# Patient Record
Sex: Female | Born: 1965 | Race: White | Hispanic: No | Marital: Married | Smoking: Never smoker
Health system: Southern US, Community
[De-identification: ages and names within clinical notes are randomized; demographics above are authoritative.]

## PROBLEM LIST (undated history)

## (undated) DIAGNOSIS — N809 Endometriosis, unspecified: Secondary | ICD-10-CM

## (undated) DIAGNOSIS — Z9189 Other specified personal risk factors, not elsewhere classified: Secondary | ICD-10-CM

## (undated) DIAGNOSIS — Z808 Family history of malignant neoplasm of other organs or systems: Secondary | ICD-10-CM

## (undated) DIAGNOSIS — A630 Anogenital (venereal) warts: Secondary | ICD-10-CM

## (undated) DIAGNOSIS — Z86010 Personal history of colonic polyps: Secondary | ICD-10-CM

## (undated) DIAGNOSIS — Z803 Family history of malignant neoplasm of breast: Secondary | ICD-10-CM

## (undated) DIAGNOSIS — D72819 Decreased white blood cell count, unspecified: Secondary | ICD-10-CM

## (undated) DIAGNOSIS — Z83719 Family history of colon polyps, unspecified: Secondary | ICD-10-CM

## (undated) DIAGNOSIS — D649 Anemia, unspecified: Secondary | ICD-10-CM

## (undated) DIAGNOSIS — Z8371 Family history of colonic polyps: Secondary | ICD-10-CM

## (undated) DIAGNOSIS — R7989 Other specified abnormal findings of blood chemistry: Secondary | ICD-10-CM

## (undated) DIAGNOSIS — N390 Urinary tract infection, site not specified: Secondary | ICD-10-CM

## (undated) HISTORY — DX: Family history of malignant neoplasm of other organs or systems: Z80.8

## (undated) HISTORY — DX: Anemia, unspecified: D64.9

## (undated) HISTORY — DX: Family history of colon polyps, unspecified: Z83.719

## (undated) HISTORY — DX: Family history of colonic polyps: Z83.71

## (undated) HISTORY — PX: ABDOMINAL HYSTERECTOMY: SHX81

## (undated) HISTORY — DX: Urinary tract infection, site not specified: N39.0

## (undated) HISTORY — DX: Other specified personal risk factors, not elsewhere classified: Z91.89

## (undated) HISTORY — DX: Endometriosis, unspecified: N80.9

## (undated) HISTORY — DX: Decreased white blood cell count, unspecified: D72.819

## (undated) HISTORY — DX: Anogenital (venereal) warts: A63.0

## (undated) HISTORY — DX: Other specified abnormal findings of blood chemistry: R79.89

## (undated) HISTORY — DX: Personal history of colonic polyps: Z86.010

## (undated) HISTORY — DX: Family history of malignant neoplasm of breast: Z80.3

---

## 2011-08-29 HISTORY — PX: BILATERAL SALPINGECTOMY: SHX5743

## 2011-08-29 HISTORY — PX: VAGINAL HYSTERECTOMY: SUR661

## 2016-06-29 HISTORY — PX: COLONOSCOPY W/ POLYPECTOMY: SHX1380

## 2016-07-15 DIAGNOSIS — Z860101 Personal history of adenomatous and serrated colon polyps: Secondary | ICD-10-CM

## 2016-07-15 DIAGNOSIS — Z86010 Personal history of colonic polyps: Secondary | ICD-10-CM | POA: Insufficient documentation

## 2016-07-15 HISTORY — DX: Personal history of colonic polyps: Z86.010

## 2016-07-15 HISTORY — DX: Personal history of adenomatous and serrated colon polyps: Z86.0101

## 2017-11-22 DIAGNOSIS — M25521 Pain in right elbow: Secondary | ICD-10-CM | POA: Diagnosis not present

## 2018-01-03 DIAGNOSIS — M7711 Lateral epicondylitis, right elbow: Secondary | ICD-10-CM | POA: Diagnosis not present

## 2018-01-17 DIAGNOSIS — D1801 Hemangioma of skin and subcutaneous tissue: Secondary | ICD-10-CM | POA: Diagnosis not present

## 2018-01-17 DIAGNOSIS — L723 Sebaceous cyst: Secondary | ICD-10-CM | POA: Diagnosis not present

## 2018-01-17 DIAGNOSIS — L814 Other melanin hyperpigmentation: Secondary | ICD-10-CM | POA: Diagnosis not present

## 2018-01-17 DIAGNOSIS — D225 Melanocytic nevi of trunk: Secondary | ICD-10-CM | POA: Diagnosis not present

## 2018-08-31 ENCOUNTER — Ambulatory Visit (INDEPENDENT_AMBULATORY_CARE_PROVIDER_SITE_OTHER): Payer: 59 | Admitting: Family Medicine

## 2018-08-31 ENCOUNTER — Encounter: Payer: Self-pay | Admitting: Family Medicine

## 2018-08-31 ENCOUNTER — Ambulatory Visit: Payer: Self-pay | Admitting: Nurse Practitioner

## 2018-08-31 ENCOUNTER — Other Ambulatory Visit: Payer: Self-pay | Admitting: Family Medicine

## 2018-08-31 VITALS — BP 104/80 | HR 68 | Temp 97.5°F | Ht 65.0 in | Wt 136.6 lb

## 2018-08-31 DIAGNOSIS — R5383 Other fatigue: Secondary | ICD-10-CM

## 2018-08-31 DIAGNOSIS — Z7689 Persons encountering health services in other specified circumstances: Secondary | ICD-10-CM

## 2018-08-31 DIAGNOSIS — Z86010 Personal history of colonic polyps: Secondary | ICD-10-CM

## 2018-08-31 DIAGNOSIS — Z87898 Personal history of other specified conditions: Secondary | ICD-10-CM

## 2018-08-31 DIAGNOSIS — Z1231 Encounter for screening mammogram for malignant neoplasm of breast: Secondary | ICD-10-CM

## 2018-08-31 NOTE — Progress Notes (Signed)
Patient presents to clinic today to establish care.  SUBJECTIVE: PMH: Pt is a 53 yo female with pmh sig for syncope, genital warts, history of polyps.  Pt previously seen outside of [place], [place].  Fatigue: - x last few wks -notes increased tiredness in afternoons -works a half day as a Optician, dispensing -endorses h/o Vit D def.and anemia -not currently taking any medications or supplements -gets 7 hrs of sleep -denies h/o snoring  H/o syncope: -happened x 2 in 1998 -pt was training for a marathon -thinks was 2/2 dehydration  H/o colon polyp: -noted in 2017 on colonoscopy -benign adenoma -will need repeat colonoscopy in 5 yrs -denies rectal bleeding or dark stools  H/o genital warts: -states "took medicine" for them -denies current lesions or issues.  Allergies: Codeine- dizzy, shakey , sweaty.  Notes emesis with a codeine derivative.  Past surg hx: Partial hysterectomy in 2012 2/2 cervical cancer.  Social hx: -pt is married.  She works as a Development worker, community.  Pt endorses social EtOH use.  Pt denies tobacco or drug use.  Does not eat breakfast.   Health Maintenance: Dental --Dr. Oren Binet Immunizations --influenza vaccine 2019, TB test 2018 Colonoscopy --2017 Mammogram --Aug 2018 PAP --2012  Past Medical History:  Diagnosis Date  . Family history of polyps in the colon   . Genital warts   . History of fainting spells of unknown cause   . UTI (urinary tract infection)     Past Surgical History:  Procedure Laterality Date  . ABDOMINAL HYSTERECTOMY      No current outpatient medications on file prior to visit.   No current facility-administered medications on file prior to visit.     Allergies  Allergen Reactions  . Codeine     History reviewed. No pertinent family history.  Social History   Socioeconomic History  . Marital status: Married    Spouse name: Not on file  . Number of children: Not on file  . Years of education: Not on file  . Highest  education level: Not on file  Occupational History  . Not on file  Social Needs  . Financial resource strain: Not on file  . Food insecurity:    Worry: Not on file    Inability: Not on file  . Transportation needs:    Medical: Not on file    Non-medical: Not on file  Tobacco Use  . Smoking status: Never Smoker  . Smokeless tobacco: Never Used  Substance and Sexual Activity  . Alcohol use: Yes  . Drug use: Never  . Sexual activity: Yes  Lifestyle  . Physical activity:    Days per week: Not on file    Minutes per session: Not on file  . Stress: Not on file  Relationships  . Social connections:    Talks on phone: Not on file    Gets together: Not on file    Attends religious service: Not on file    Active member of club or organization: Not on file    Attends meetings of clubs or organizations: Not on file    Relationship status: Not on file  . Intimate partner violence:    Fear of current or ex partner: Not on file    Emotionally abused: Not on file    Physically abused: Not on file    Forced sexual activity: Not on file  Other Topics Concern  . Not on file  Social History Narrative  . Not on file  ROS General: Denies fever, chills, night sweats, changes in weight, changes in appetite  +fatigue HEENT: Denies headaches, ear pain, changes in vision, rhinorrhea, sore throat CV: Denies CP, palpitations, SOB, orthopnea Pulm: Denies SOB, cough, wheezing GI: Denies abdominal pain, nausea, vomiting, diarrhea, constipation GU: Denies dysuria, hematuria, frequency, vaginal discharge Msk: Denies muscle cramps, joint pains Neuro: Denies weakness, numbness, tingling Skin: Denies rashes, bruising Psych: Denies depression, anxiety, hallucinations   BP 104/80 (BP Location: Right Arm, Patient Position: Sitting, Cuff Size: Normal)   Pulse 68   Temp (!) 97.5 F (36.4 C) (Oral)   Ht 5\' 5"  (1.651 m)   Wt 136 lb 9.6 oz (62 kg)   SpO2 98%   BMI 22.73 kg/m   Physical  Exam Gen. Pleasant, well developed, well-nourished, in NAD HEENT - [place]/AT, PERRLA,  no scleral icterus, no nasal drainage, pharynx without erythema or exudate.  TMs nml b/l. Lungs: no use of accessory muscles, CTAB, no wheezes, rales or rhonchi Cardiovascular: RRR, No r/g/m, no peripheral edema Abdomen: BS present, soft, nontender,nondistended Neuro:  A&Ox3, CN II-XII intact, normal gait Skin:  Warm, dry, intact, no lesions  No results found for this or any previous visit (from the past 2160 hour(s)).  Assessment/Plan: Fatigue, unspecified type -Discussed various causes including anemia, vitamin D deficiency, thyroid dysfunction -Patient wishes to wait on labs until CPE -Given handout -Encouraged to stay hydrated, continue exercising, eat a balanced diet.  History of syncope -Resolved -Pt encouraged to stay hydrated and eat regularly  History of colon polyps -f/u colonoscopy in 2022  Encounter to establish care -We reviewed the PMH, PSH, FH, SH, Meds and Allergies. -We provided refills for any medications we will prescribe as needed. -We addressed current concerns per orders and patient instructions. -We have asked for records for pertinent exams, studies, vaccines and notes from previous providers. -We have advised patient to follow up per instructions below.  Follow-up in the next few weeks for CPE and labs  Grier Mitts, MD

## 2018-08-31 NOTE — Patient Instructions (Signed)

## 2018-09-12 ENCOUNTER — Ambulatory Visit (INDEPENDENT_AMBULATORY_CARE_PROVIDER_SITE_OTHER): Payer: 59 | Admitting: Family Medicine

## 2018-09-12 ENCOUNTER — Encounter: Payer: Self-pay | Admitting: Family Medicine

## 2018-09-12 VITALS — BP 98/68 | HR 62 | Temp 97.9°F | Wt 136.0 lb

## 2018-09-12 DIAGNOSIS — Z13220 Encounter for screening for lipoid disorders: Secondary | ICD-10-CM | POA: Diagnosis not present

## 2018-09-12 DIAGNOSIS — Z0000 Encounter for general adult medical examination without abnormal findings: Secondary | ICD-10-CM

## 2018-09-12 DIAGNOSIS — Z131 Encounter for screening for diabetes mellitus: Secondary | ICD-10-CM

## 2018-09-12 LAB — BASIC METABOLIC PANEL
BUN: 14 mg/dL (ref 6–23)
CO2: 30 mEq/L (ref 19–32)
Calcium: 9.2 mg/dL (ref 8.4–10.5)
Chloride: 106 mEq/L (ref 96–112)
Creatinine, Ser: 0.81 mg/dL (ref 0.40–1.20)
GFR: 78.55 mL/min (ref >60.00)
Glucose, Bld: 81 mg/dL (ref 70–99)
Potassium: 3.9 mEq/L (ref 3.5–5.1)
Sodium: 142 mEq/L (ref 135–145)

## 2018-09-12 LAB — CBC
HCT: 44.4 % (ref 36.0–46.0)
Hemoglobin: 15.1 g/dL — ABNORMAL HIGH (ref 12.0–15.0)
MCHC: 34 g/dL (ref 30.0–36.0)
MCV: 92.4 fl (ref 78.0–100.0)
Platelets: 163 10*3/uL (ref 150.0–400.0)
RBC: 4.8 Mil/uL (ref 3.87–5.11)
RDW: 12.5 % (ref 11.5–15.5)
WBC: 3.5 10*3/uL — ABNORMAL LOW (ref 4.0–10.5)

## 2018-09-12 LAB — LIPID PANEL
CHOLESTEROL: 191 mg/dL (ref 0–200)
HDL: 73.9 mg/dL (ref >39.00)
LDL Cholesterol: 109 mg/dL — ABNORMAL HIGH (ref 0–99)
NonHDL: 117.58
Total CHOL/HDL Ratio: 3
Triglycerides: 44 mg/dL (ref 0.0–149.0)
VLDL: 8.8 mg/dL (ref 0.0–40.0)

## 2018-09-12 LAB — HEMOGLOBIN A1C: Hgb A1c MFr Bld: 5.3 % (ref 4.6–6.5)

## 2018-09-12 NOTE — Progress Notes (Signed)
Subjective:     Mercedes Kirby is a 53 y.o. female and is here for a comprehensive physical exam. The patient reports no problems.  Pt notes a hx of throat cancer in her dad and grandfather.  Neither used tobacco products.  Patient thinks she had a Tdap recently. Patient scheduling mammogram.  States radiologist would like to review her prior records to determine if she needs a diagnostic mammogram.  Colonoscopy-2017 Influenza vaccine 2019, TB skin test 2018  Pap-2012 status post partial hysterectomy.  Patient unsure if she is going into menopause as she had intense sweating at night in the hot flash while cooking.  Pt's mother underwent complete hysterectomy and her sisters are younger than she is so she is unable to asked them about menopause.  Social History   Socioeconomic History  . Marital status: Married    Spouse name: Not on file  . Number of children: Not on file  . Years of education: Not on file  . Highest education level: Not on file  Occupational History  . Not on file  Social Needs  . Financial resource strain: Not on file  . Food insecurity:    Worry: Not on file    Inability: Not on file  . Transportation needs:    Medical: Not on file    Non-medical: Not on file  Tobacco Use  . Smoking status: Never Smoker  . Smokeless tobacco: Never Used  Substance and Sexual Activity  . Alcohol use: Yes  . Drug use: Never  . Sexual activity: Yes  Lifestyle  . Physical activity:    Days per week: Not on file    Minutes per session: Not on file  . Stress: Not on file  Relationships  . Social connections:    Talks on phone: Not on file    Gets together: Not on file    Attends religious service: Not on file    Active member of club or organization: Not on file    Attends meetings of clubs or organizations: Not on file    Relationship status: Not on file  . Intimate partner violence:    Fear of current or ex partner: Not on file    Emotionally abused: Not on file     Physically abused: Not on file    Forced sexual activity: Not on file  Other Topics Concern  . Not on file  Social History Narrative  . Not on file   Health Maintenance  Topic Date Due  . HIV Screening  06/29/1980  . TETANUS/TDAP  06/29/1984  . PAP SMEAR-Modifier  06/29/1986  . MAMMOGRAM  06/30/2015  . COLONOSCOPY  06/30/2015  . INFLUENZA VACCINE  Completed    The following portions of the patient's history were reviewed and updated as appropriate: allergies, current medications, past family history, past medical history, past social history, past surgical history and problem list.  Review of Systems Pertinent items noted in HPI and remainder of comprehensive ROS otherwise negative.   Objective:    BP 98/68 (BP Location: Left Arm, Patient Position: Sitting, Cuff Size: Normal)   Pulse 62   Temp 97.9 F (36.6 C) (Oral)   Wt 136 lb (61.7 kg)   SpO2 99%   BMI 22.63 kg/m  General appearance: alert, cooperative, appears stated age and no distress Head: Normocephalic, without obvious abnormality, atraumatic Eyes: conjunctivae/corneas clear. PERRL, EOM's intact. Fundi benign. Ears: normal TM's and external ear canals both ears Nose: Nares normal. Septum midline. Mucosa normal.  No drainage or sinus tenderness. Throat: lips, mucosa, and tongue normal; teeth and gums normal Neck: no adenopathy, no carotid bruit, no JVD, supple, symmetrical, trachea midline and thyroid not enlarged, symmetric, no tenderness/mass/nodules Lungs: clear to auscultation bilaterally Heart: regular rate and rhythm, S1, S2 normal, no murmur, click, rub or gallop Abdomen: soft, non-tender; bowel sounds normal; no masses,  no organomegaly Extremities: extremities normal, atraumatic, no cyanosis or edema Skin: Skin color, texture, turgor normal. No rashes or lesions Neurologic: Alert and oriented X 3, normal strength and tone. Normal symmetric reflexes. Normal coordination and gait    Assessment:     Healthy female exam.      Plan:     Anticipatory guidance given including wearing seatbelts, smoke detectors in the home, increasing physical activity, increasing p.o. intake of water and vegetables. -given handout -Obtain labs this visit -Patient to check on mammogram.  Will place orders for diagnostic mammogram if needed -Colonoscopy up-to-date -Immunizations up-to-date.  Patient to provide immunization record regarding Tdap. -Next CPE in 1 year See After Visit Summary for Counseling Recommendations    Given information regarding perimenopause and menopause.  Follow-up PRN  Grier Mitts, MD

## 2018-09-12 NOTE — Patient Instructions (Signed)
Preventive Care 40-64 Years, Female Preventive care refers to lifestyle choices and visits with your health care provider that can promote health and wellness. What does preventive care include?   A yearly physical exam. This is also called an annual well check.  Dental exams once or twice a year.  Routine eye exams. Ask your health care provider how often you should have your eyes checked.  Personal lifestyle choices, including: ? Daily care of your teeth and gums. ? Regular physical activity. ? Eating a healthy diet. ? Avoiding tobacco and drug use. ? Limiting alcohol use. ? Practicing safe sex. ? Taking low-dose aspirin daily starting at age 50. ? Taking vitamin and mineral supplements as recommended by your health care provider. What happens during an annual well check? The services and screenings done by your health care provider during your annual well check will depend on your age, overall health, lifestyle risk factors, and family history of disease. Counseling Your health care provider may ask you questions about your:  Alcohol use.  Tobacco use.  Drug use.  Emotional well-being.  Home and relationship well-being.  Sexual activity.  Eating habits.  Work and work environment.  Method of birth control.  Menstrual cycle.  Pregnancy history. Screening You may have the following tests or measurements:  Height, weight, and BMI.  Blood pressure.  Lipid and cholesterol levels. These may be checked every 5 years, or more frequently if you are over 50 years old.  Skin check.  Lung cancer screening. You may have this screening every year starting at age 55 if you have a 30-pack-year history of smoking and currently smoke or have quit within the past 15 years.  Colorectal cancer screening. All adults should have this screening starting at age 50 and continuing until age 75. Your health care provider may recommend screening at age 45. You will have tests every  1-10 years, depending on your results and the type of screening test. People at increased risk should start screening at an earlier age. Screening tests may include: ? Guaiac-based fecal occult blood testing. ? Fecal immunochemical test (FIT). ? Stool DNA test. ? Virtual colonoscopy. ? Sigmoidoscopy. During this test, a flexible tube with a tiny camera (sigmoidoscope) is used to examine your rectum and lower colon. The sigmoidoscope is inserted through your anus into your rectum and lower colon. ? Colonoscopy. During this test, a long, thin, flexible tube with a tiny camera (colonoscope) is used to examine your entire colon and rectum.  Hepatitis C blood test.  Hepatitis B blood test.  Sexually transmitted disease (STD) testing.  Diabetes screening. This is done by checking your blood sugar (glucose) after you have not eaten for a while (fasting). You may have this done every 1-3 years.  Mammogram. This may be done every 1-2 years. Talk to your health care provider about when you should start having regular mammograms. This may depend on whether you have a family history of breast cancer.  BRCA-related cancer screening. This may be done if you have a family history of breast, ovarian, tubal, or peritoneal cancers.  Pelvic exam and Pap test. This may be done every 3 years starting at age 21. Starting at age 30, this may be done every 5 years if you have a Pap test in combination with an HPV test.  Bone density scan. This is done to screen for osteoporosis. You may have this scan if you are at high risk for osteoporosis. Discuss your test results, treatment options,   and if necessary, the need for more tests with your health care provider. Vaccines Your health care provider may recommend certain vaccines, such as:  Influenza vaccine. This is recommended every year.  Tetanus, diphtheria, and acellular pertussis (Tdap, Td) vaccine. You may need a Td booster every 10 years.  Varicella  vaccine. You may need this if you have not been vaccinated.  Zoster vaccine. You may need this after age 41.  Measles, mumps, and rubella (MMR) vaccine. You may need at least one dose of MMR if you were born in 1957 or later. You may also need a second dose.  Pneumococcal 13-valent conjugate (PCV13) vaccine. You may need this if you have certain conditions and were not previously vaccinated.  Pneumococcal polysaccharide (PPSV23) vaccine. You may need one or two doses if you smoke cigarettes or if you have certain conditions.  Meningococcal vaccine. You may need this if you have certain conditions.  Hepatitis A vaccine. You may need this if you have certain conditions or if you travel or work in places where you may be exposed to hepatitis A.  Hepatitis B vaccine. You may need this if you have certain conditions or if you travel or work in places where you may be exposed to hepatitis B.  Haemophilus influenzae type b (Hib) vaccine. You may need this if you have certain conditions. Talk to your health care provider about which screenings and vaccines you need and how often you need them. This information is not intended to replace advice given to you by your health care provider. Make sure you discuss any questions you have with your health care provider. Document Released: 09/11/2015 Document Revised: 10/05/2017 Document Reviewed: 06/16/2015 Elsevier Interactive Patient Education  2019 [place].  Menopause Menopause is the normal time of life when menstrual periods stop completely. It is usually confirmed by 12 months without a menstrual period. The transition to menopause (perimenopause) most often happens between the ages of 70 and 90. During perimenopause, hormone levels change in your body, which can cause symptoms and affect your health. Menopause may increase your risk for:  Loss of bone (osteoporosis), which causes bone breaks (fractures).  Depression.  Hardening and narrowing  of the arteries (atherosclerosis), which can cause heart attacks and strokes. What are the causes? This condition is usually caused by a natural change in hormone levels that happens as you get older. The condition may also be caused by surgery to remove both ovaries (bilateral oophorectomy). What increases the risk? This condition is more likely to start at an earlier age if you have certain medical conditions or treatments, including:  A tumor of the pituitary gland in the brain.  A disease that affects the ovaries and hormone production.  Radiation treatment for cancer.  Certain cancer treatments, such as chemotherapy or hormone (anti-estrogen) therapy.  Heavy smoking and excessive alcohol use.  Family history of early menopause. This condition is also more likely to develop earlier in women who are very thin. What are the signs or symptoms? Symptoms of this condition include:  Hot flashes.  Irregular menstrual periods.  Night sweats.  Changes in feelings about sex. This could be a decrease in sex drive or an increased comfort around your sexuality.  Vaginal dryness and thinning of the vaginal walls. This may cause painful intercourse.  Dryness of the skin and development of wrinkles.  Headaches.  Problems sleeping (insomnia).  Mood swings or irritability.  Memory problems.  Weight gain.  Hair growth on the face  and chest.  Bladder infections or problems with urinating. How is this diagnosed? This condition is diagnosed based on your medical history, a physical exam, your age, your menstrual history, and your symptoms. Hormone tests may also be done. How is this treated? In some cases, no treatment is needed. You and your health care provider should make a decision together about whether treatment is necessary. Treatment will be based on your individual condition and preferences. Treatment for this condition focuses on managing symptoms. Treatment may  include:  Menopausal hormone therapy (MHT).  Medicines to treat specific symptoms or complications.  Acupuncture.  Vitamin or herbal supplements. Before starting treatment, make sure to let your health care provider know if you have a personal or family history of:  Heart disease.  Breast cancer.  Blood clots.  Diabetes.  Osteoporosis. Follow these instructions at home: Lifestyle  Do not use any products that contain nicotine or tobacco, such as cigarettes and e-cigarettes. If you need help quitting, ask your health care provider.  Get at least 30 minutes of physical activity on 5 or more days each week.  Avoid alcoholic and caffeinated beverages, as well as spicy foods. This may help prevent hot flashes.  Get 7-8 hours of sleep each night.  If you have hot flashes, try: ? Dressing in layers. ? Avoiding things that may trigger hot flashes, such as spicy food, warm places, or stress. ? Taking slow, deep breaths when a hot flash starts. ? Keeping a fan in your home and office.  Find ways to manage stress, such as deep breathing, meditation, or journaling.  Consider going to group therapy with other women who are having menopause symptoms. Ask your health care provider about recommended group therapy meetings. Eating and drinking  Eat a healthy, balanced diet that contains whole grains, lean protein, low-fat dairy, and plenty of fruits and vegetables.  Your health care provider may recommend adding more soy to your diet. Foods that contain soy include tofu, tempeh, and soy milk.  Eat plenty of foods that contain calcium and vitamin D for bone health. Items that are rich in calcium include low-fat milk, yogurt, beans, almonds, sardines, broccoli, and kale. Medicines  Take over-the-counter and prescription medicines only as told by your health care provider.  Talk with your health care provider before starting any herbal supplements. If prescribed, take vitamins and  supplements as told by your health care provider. These may include: ? Calcium. Women age 73 and older should get 1,200 mg (milligrams) of calcium every day. ? Vitamin D. Women need 600-800 International Units of vitamin D each day. ? Vitamins B12 and B6. Aim for 50 micrograms of B12 and 1.5 mg of B6 each day. General instructions  Keep track of your menstrual periods, including: ? When they occur. ? How heavy they are and how long they last. ? How much time passes between periods.  Keep track of your symptoms, noting when they start, how often you have them, and how long they last.  Use vaginal lubricants or moisturizers to help with vaginal dryness and improve comfort during sex.  Keep all follow-up visits as told by your health care provider. This is important. This includes any group therapy or counseling. Contact a health care provider if:  You are still having menstrual periods after age 72.  You have pain during sex.  You have not had a period for 12 months and you develop vaginal bleeding. Get help right away if:  You have: ?  Severe depression. ? Excessive vaginal bleeding. ? Pain when you urinate. ? A fast or irregular heart beat (palpitations). ? Severe headaches. ? Abdomen (abdominal) pain or severe indigestion.  You fell and you think you have a broken bone.  You develop leg or chest pain.  You develop vision problems.  You feel a lump in your breast. Summary  Menopause is the normal time of life when menstrual periods stop completely. It is usually confirmed by 12 months without a menstrual period.  The transition to menopause (perimenopause) most often happens between the ages of 50 and 37.  Symptoms can be managed through medicines, lifestyle changes, and complementary therapies such as acupuncture.  Eat a balanced diet that is rich in nutrients to promote bone health and heart health and to manage symptoms during menopause. This information is not  intended to replace advice given to you by your health care provider. Make sure you discuss any questions you have with your health care provider. Document Released: 11/05/2003 Document Revised: 09/17/2016 Document Reviewed: 09/17/2016 Elsevier Interactive Patient Education  2019 [place] is the normal time of life before and after menstrual periods stop completely (menopause). Perimenopause can begin 2-8 years before menopause, and it usually lasts for 1 year after menopause. During perimenopause, the ovaries may or may not produce an egg. What are the causes? This condition is caused by a natural change in hormone levels that happens as you get older. What increases the risk? This condition is more likely to start at an earlier age if you have certain medical conditions or treatments, including:  A tumor of the pituitary gland in the brain.  A disease that affects the ovaries and hormone production.  Radiation treatment for cancer.  Certain cancer treatments, such as chemotherapy or hormone (anti-estrogen) therapy.  Heavy smoking and excessive alcohol use.  Family history of early menopause. What are the signs or symptoms? Perimenopausal changes affect each woman differently. Symptoms of this condition may include:  Hot flashes.  Night sweats.  Irregular menstrual periods.  Decreased sex drive.  Vaginal dryness.  Headaches.  Mood swings.  Depression.  Memory problems or trouble concentrating.  Irritability.  Tiredness.  Weight gain.  Anxiety.  Trouble getting pregnant. How is this diagnosed? This condition is diagnosed based on your medical history, a physical exam, your age, your menstrual history, and your symptoms. Hormone tests may also be done. How is this treated? In some cases, no treatment is needed. You and your health care provider should make a decision together about whether treatment is necessary. Treatment  will be based on your individual condition and preferences. Various treatments are available, such as:  Menopausal hormone therapy (MHT).  Medicines to treat specific symptoms.  Acupuncture.  Vitamin or herbal supplements. Before starting treatment, make sure to let your health care provider know if you have a personal or family history of:  Heart disease.  Breast cancer.  Blood clots.  Diabetes.  Osteoporosis. Follow these instructions at home: Lifestyle  Do not use any products that contain nicotine or tobacco, such as cigarettes and e-cigarettes. If you need help quitting, ask your health care provider.  Eat a balanced diet that includes fresh fruits and vegetables, whole grains, soybeans, eggs, lean meat, and low-fat dairy.  Get at least 30 minutes of physical activity on 5 or more days each week.  Avoid alcoholic and caffeinated beverages, as well as spicy foods. This may help prevent hot flashes.  Get 7-8  hours of sleep each night.  Dress in layers that can be removed to help you manage hot flashes.  Find ways to manage stress, such as deep breathing, meditation, or journaling. General instructions  Keep track of your menstrual periods, including: ? When they occur. ? How heavy they are and how long they last. ? How much time passes between periods.  Keep track of your symptoms, noting when they start, how often you have them, and how long they last.  Take over-the-counter and prescription medicines only as told by your health care provider.  Take vitamin supplements only as told by your health care provider. These may include calcium, vitamin E, and vitamin D.  Use vaginal lubricants or moisturizers to help with vaginal dryness and improve comfort during sex.  Talk with your health care provider before starting any herbal supplements.  Keep all follow-up visits as told by your health care provider. This is important. This includes any group therapy or  counseling. Contact a health care provider if:  You have heavy vaginal bleeding or pass blood clots.  Your period lasts more than 2 days longer than normal.  Your periods are recurring sooner than 21 days.  You bleed after having sex. Get help right away if:  You have chest pain, trouble breathing, or trouble talking.  You have severe depression.  You have pain when you urinate.  You have severe headaches.  You have vision problems. Summary  Perimenopause is the time when a woman's body begins to move into menopause. This may happen naturally or as a result of other health problems or medical treatments.  Perimenopause can begin 2-8 years before menopause, and it usually lasts for 1 year after menopause.  Perimenopausal symptoms can be managed through medicines, lifestyle changes, and complementary therapies such as acupuncture. This information is not intended to replace advice given to you by your health care provider. Make sure you discuss any questions you have with your health care provider. Document Released: 09/22/2004 Document Revised: 09/20/2016 Document Reviewed: 09/20/2016 Elsevier Interactive Patient Education  2019 [employer].

## 2018-09-18 ENCOUNTER — Encounter: Payer: Self-pay | Admitting: Family Medicine

## 2018-10-04 ENCOUNTER — Ambulatory Visit
Admission: RE | Admit: 2018-10-04 | Discharge: 2018-10-04 | Disposition: A | Discharge status: Home / Self Care | Payer: 59 | Source: Ambulatory Visit | Attending: Family Medicine | Admitting: Family Medicine

## 2018-10-04 DIAGNOSIS — Z1231 Encounter for screening mammogram for malignant neoplasm of breast: Secondary | ICD-10-CM | POA: Diagnosis not present

## 2018-10-30 ENCOUNTER — Other Ambulatory Visit: Payer: Self-pay | Admitting: Family Medicine

## 2018-10-30 DIAGNOSIS — R5383 Other fatigue: Secondary | ICD-10-CM

## 2018-10-31 ENCOUNTER — Other Ambulatory Visit (INDEPENDENT_AMBULATORY_CARE_PROVIDER_SITE_OTHER): Payer: 59

## 2018-10-31 DIAGNOSIS — R5383 Other fatigue: Secondary | ICD-10-CM

## 2018-10-31 LAB — VITAMIN D 25 HYDROXY (VIT D DEFICIENCY, FRACTURES): VITD: 24.87 ng/mL — ABNORMAL LOW (ref 30.00–100.00)

## 2018-10-31 LAB — T4, FREE: FREE T4: 0.83 ng/dL (ref 0.60–1.60)

## 2018-10-31 LAB — TSH: TSH: 1.13 u[IU]/mL (ref 0.35–4.50)

## 2018-11-01 ENCOUNTER — Other Ambulatory Visit: Payer: Self-pay | Admitting: Family Medicine

## 2018-11-01 DIAGNOSIS — E559 Vitamin D deficiency, unspecified: Secondary | ICD-10-CM

## 2018-11-01 MED ORDER — VITAMIN D (ERGOCALCIFEROL) 1.25 MG (50000 UNIT) PO CAPS: 50000.0000 [IU] | ORAL_CAPSULE | ORAL | 0 refills | Status: DC

## 2019-02-20 ENCOUNTER — Other Ambulatory Visit: Payer: Self-pay

## 2019-02-20 ENCOUNTER — Ambulatory Visit (INDEPENDENT_AMBULATORY_CARE_PROVIDER_SITE_OTHER): Payer: 59 | Admitting: Family Medicine

## 2019-02-20 DIAGNOSIS — J029 Acute pharyngitis, unspecified: Secondary | ICD-10-CM | POA: Diagnosis not present

## 2019-02-20 NOTE — Progress Notes (Signed)
Patient ID: Mercedes Kirby, female   DOB: 1966-01-21, 53 y.o.   MRN: [id number]  This visit type was conducted due to national recommendations for restrictions regarding the COVID-19 pandemic in an effort to limit this patient's exposure and mitigate transmission in our community.   Virtual Visit via Video Note  I connected with Mercedes Kirby on 02/20/19 at  2:30 PM EDT by a video enabled telemedicine application and verified that I am speaking with the correct person using two identifiers.  Location patient: home Location provider:work or home office Persons participating in the virtual visit: patient, provider  I discussed the limitations of evaluation and management by telemedicine and the availability of in person appointments. The patient expressed understanding and agreed to proceed.   HPI:  Patient is seen with sore throat symptoms by a virtual visit.  She works at [employer] as a Development worker, community.  She states that last week she helped provide care for a patient who subsequently tested positive for COVID.  She developed onset of sore throat Monday and this has gotten progressively worse since then.  No fever.  No nasal congestion.  No cough.  No dyspnea.  No obvious lymphadenopathy.  Has had some increased fatigue.  She had COVID's test yesterday which is still pending.  She took some DayQuil but had some potential side effects.  Denies history of recurrent strep throat.  She does recall having scarlet fever as a child.  No recent skin rash.  No obvious postnasal drip symptoms.  ROS: See pertinent positives and negatives per HPI.  Past Medical History:  Diagnosis Date  . Family history of polyps in the colon   . Genital warts   . History of fainting spells of unknown cause   . UTI (urinary tract infection)     Past Surgical History:  Procedure Laterality Date  . ABDOMINAL HYSTERECTOMY      Family History  Problem Relation Age of Onset  . Breast cancer Sister  38    SOCIAL HX: Non-smoker   Current Outpatient Medications:  Marland Kitchen  Vitamin D, Ergocalciferol, (DRISDOL) 1.25 MG (50000 UT) CAPS capsule, Take 1 capsule (50,000 Units total) by mouth every 7 (seven) days., Disp: 12 capsule, Rfl: 0  EXAM:  VITALS per patient if applicable:  GENERAL: alert, oriented, appears well and in no acute distress  HEENT: atraumatic, conjunttiva clear, no obvious abnormalities on inspection of external nose and ears  NECK: normal movements of the head and neck  LUNGS: on inspection no signs of respiratory distress, breathing rate appears normal, no obvious gross SOB, gasping or wheezing  CV: no obvious cyanosis  MS: moves all visible extremities without noticeable abnormality  PSYCH/NEURO: pleasant and cooperative, no obvious depression or anxiety, speech and thought processing grossly intact  ASSESSMENT AND PLAN:  Discussed the following assessment and plan:  Sore throat symptoms.  COVID-19 screen pending.  Suspect this is most likely viral.  -She would like to await results of COVID screen first.  If negative and symptoms persist will consider in office screen for strep by tomorrow or Friday -Otherwise, continue symptomatic treatment with Tylenol and/or Advil and salt water gargles.    I discussed the assessment and treatment plan with the patient. The patient was provided an opportunity to ask questions and all were answered. The patient agreed with the plan and demonstrated an understanding of the instructions.   The patient was advised to call back or seek an in-person evaluation if the symptoms worsen  or if the condition fails to improve as anticipated.   Carolann Littler, MD

## 2019-02-22 ENCOUNTER — Other Ambulatory Visit: Payer: Self-pay

## 2019-02-22 ENCOUNTER — Ambulatory Visit: Payer: Self-pay | Admitting: Physician Assistant

## 2019-02-22 ENCOUNTER — Telehealth: Payer: Self-pay

## 2019-02-22 VITALS — BP 115/80 | HR 72 | Temp 98.3°F | Resp 16 | Wt 140.0 lb

## 2019-02-22 DIAGNOSIS — J029 Acute pharyngitis, unspecified: Secondary | ICD-10-CM

## 2019-02-22 LAB — POCT RAPID STREP A (OFFICE): Rapid Strep A Screen: NEGATIVE

## 2019-02-22 NOTE — Telephone Encounter (Signed)
Copied from [place] [phone]. Topic: General - Other >> Feb 22, 2019 11:26 AM Leward Quan A wrote: Reason for CRM: Patient had a virtual visit with Dr Elease Hashimoto he wanted her to have a throat swab but she had to wait on covid results which came back but patient is still experiencing the sore throat and need a swab. Please call Ph# [phone]

## 2019-02-22 NOTE — Progress Notes (Signed)
MRN: [id number] DOB: Oct 23, 1965  Subjective:   Mercedes Kirby is a 53 y.o. female presenting for chief complaint of Sore Throat (4 DAYS ) .  Reports 4 day history of sore throat. 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 She was wearing appropriate PPE. Few days later, developed a sore throat. Informed [facility] and was sent for covid-19 testing. She received her results today and they were negative. She did virtual visit with her PCP yesterday and they told her if her sore throat persisted she could get tested for strep. She contacted them for an appointment today after receiving her covid test results but they could not get her in. She notes her sore throat has significantly improved since it started. She denies fever, sinus headache, sinus congestion , sinus pain, rhinorrhea, itchy watery eyes, ear fullness, ear drainage, inability to swallow, voice change, dry cough, productive cough, wheezing, shortness of breath, chest pain and myalgia, night sweats, chills, fatigue, malaise, nausea, vomiting, abdominal pain and diarrhea. Denies PMH of DM, HTN, heart disease, kidney disease, and seasonal allergies. Denies smoking.  Denies any other aggravating or relieving factors, no other questions or concerns.  Review of Systems  Musculoskeletal: Negative for neck pain.  Skin: Negative for rash.  Neurological: Negative for headaches.    Mercedes Kirby has a current medication list which includes the following prescription(s): multivitamin and vitamin d (ergocalciferol). Also is allergic to codeine.  Mercedes Kirby  has a past medical history of Family history of polyps in the colon, Genital warts, History of fainting spells of unknown cause, and UTI (urinary tract infection). Also  has a past surgical history that includes Abdominal hysterectomy.   Objective:   Vitals: BP 115/80 (BP Location: Right Arm, Patient Position: Sitting, Cuff Size:  Normal)   Pulse 72   Temp 98.3 F (36.8 C) (Oral)   Resp 16   Wt 140 lb (63.5 kg)   SpO2 98%   BMI 23.30 kg/m   Physical Exam Vitals signs reviewed.  Constitutional:      General: She is not in acute distress.    Appearance: She is well-developed. She is not ill-appearing or toxic-appearing.  HENT:     Head: Normocephalic and atraumatic.     Right Ear: Tympanic membrane, ear canal and external ear normal.     Left Ear: Tympanic membrane, ear canal and external ear normal.     Nose: Nose normal.     Right Sinus: No maxillary sinus tenderness or frontal sinus tenderness.     Left Sinus: No maxillary sinus tenderness or frontal sinus tenderness.     Mouth/Throat:     Lips: Pink.     Mouth: Mucous membranes are moist.     Pharynx: Uvula midline. Posterior oropharyngeal erythema present.     Tonsils: No tonsillar exudate or tonsillar abscesses. 1+ on the right. 1+ on the left.     Comments: Soft palate with mild erythema. No erythema or swelling of b/l tonsils. No peritonsillar edema.  Eyes:     Conjunctiva/sclera: Conjunctivae normal.  Neck:     Musculoskeletal: Normal range of motion.     Trachea: Phonation normal.  Pulmonary:     Effort: Pulmonary effort is normal.  Lymphadenopathy:     Head:     Right side of head: No submental, submandibular, tonsillar, preauricular, posterior auricular or occipital adenopathy.     Left side of head: No submental, submandibular, tonsillar, preauricular,  posterior auricular or occipital adenopathy.     Cervical: No cervical adenopathy.     Upper Body:     Right upper body: No supraclavicular adenopathy.     Left upper body: No supraclavicular adenopathy.  Skin:    General: Skin is warm and dry.  Neurological:     Mental Status: She is alert.     Results for orders placed or performed in visit on 02/22/19 (from the past 24 hour(s))  POCT rapid strep A     Status: Normal   Collection Time: 02/22/19  4:28 PM  Result Value Ref Range    Rapid Strep A Screen Negative Negative    Centor Score of 0.   Assessment and Plan :  1. Sore throat Pt is overall well appearing, NAD. VSS. Rapid strep test negative. Centor score of 0. This is likely viral in etiology. Reassuring that she has had negative covid-19 testing and her sore throat is improving. Since her symptoms are improving, would recommend continuing with supportive measures at this time-OTC ibuprofen, tylenol, warm tea with honey, and soft diet. No nuchal rigidity, peritonsillar edema, drooling, stridor,hot potato voice, or signs of respiratory distressnoted on exam to suggest more concerning etiology of sore throat such as epiglottitis, peritonsillar abscess, retropharyngeal abscess. Would recommend f/u with PCP if no full improvement in 5-7 days as she may warrant strep culture or other labs at that time. Encouraged to seek care sooner if sx worsen or she develops new concerning sx. Pt voices her understanding.  - POCT rapid strep A    Tenna Delaine, [place] 02/22/2019 4:39 PM

## 2019-02-22 NOTE — Patient Instructions (Addendum)
Sore Throat   Your strep test is negative. We do not perform strep cultures at our office.  You may take ibuprofen 400mg -600mg  with food for your throat every 8 hours. Tylenol can also be used for sore throat. Eat soft foods until symptoms resolve.  - You may also use 2 tablespoons of warm honey every 4-6 hours to coat and soothe your throat. - Drink plenty of water, at least 64 ounces daily and rest to make sure your body has a chance to get better. - Follow up with your primary care doctor if symptoms do not resolved after 5-7 days. Seek care sooner at urgent care or ED if symptoms worsen or you develop new concerning symptoms with treatment plan.    When you have a sore throat, your throat may feel:  Tender.  Burning.  Irritated.  Scratchy.  Painful when you swallow.  Painful when you talk. Many things can cause a sore throat, such as:  An infection.  Allergies.  Dry air.  Smoke or pollution.  Radiation treatment.  Gastroesophageal reflux disease (GERD).  A tumor. A sore throat can be the first sign of another sickness. It can happen with other problems, like:  Coughing.  Sneezing.  Fever.  Swelling in the neck. Most sore throats go away without treatment. Follow these instructions at home:      Take over-the-counter medicines only as told by your doctor. ? If your child has a sore throat, do not give your child aspirin.  Drink enough fluids to keep your pee (urine) pale yellow.  Rest when you feel you need to.  To help with pain: ? Sip warm liquids, such as broth, herbal tea, or warm water. ? Eat or drink cold or frozen liquids, such as frozen ice pops. ? Gargle with a salt-water mixture 3-4 times a day or as needed. To make a salt-water mixture, add -1 tsp (3-6 g) of salt to 1 cup (237 mL) of warm water. Mix it until you cannot see the salt anymore. ? Suck on hard candy or throat lozenges. ? Put a cool-mist humidifier in your bedroom at night. ?  Sit in the bathroom with the door closed for 5-10 minutes while you run hot water in the shower.  Do not use any products that contain nicotine or tobacco, such as cigarettes, e-cigarettes, and chewing tobacco. If you need help quitting, ask your doctor.  Wash your hands well and often with soap and water. If soap and water are not available, use hand sanitizer. Contact a doctor if:  You have a fever for more than 2-3 days.  You keep having symptoms for more than 2-3 days.  Your throat does not get better in 7 days.  You have a fever and your symptoms suddenly get worse.  Your child who is 3 months to 53 years old has a temperature of 102.90F (39C) or higher. Get help right away if:  You have trouble breathing.  You cannot swallow fluids, soft foods, or your saliva.  You have swelling in your throat or neck that gets worse.  You keep feeling sick to your stomach (nauseous).  You keep throwing up (vomiting). Summary  A sore throat is pain, burning, irritation, or scratchiness in the throat. Many things can cause a sore throat.  Take over-the-counter medicines only as told by your doctor. Do not give your child aspirin.  Drink plenty of fluids, and rest as needed.  Contact a doctor if your symptoms get worse  or your sore throat does not get better within 7 days. This information is not intended to replace advice given to you by your health care provider. Make sure you discuss any questions you have with your health care provider. Document Released: 05/24/2008 Document Revised: 01/15/2018 Document Reviewed: 01/15/2018 Elsevier Patient Education  2020 [employer].

## 2019-03-04 NOTE — Telephone Encounter (Signed)
Spoke with pt very upset that the office did not return her call regarding her concern with continuing Sore throat after a virtual visit with Dr Elease Hashimoto, pt states that she requested to have a follow up with dr Elease Hashimoto since he had treated her previously but was told that she will need to see her PCP Dr Volanda Napoleon. Expressed my sincere apologies to pt but she was very upset stated that she did not receive the care that she needed and had to [facility] to the [facility] for [facility].

## 2019-03-04 NOTE — Telephone Encounter (Signed)
Provider notified and message routed

## 2019-08-30 DIAGNOSIS — Z9189 Other specified personal risk factors, not elsewhere classified: Secondary | ICD-10-CM

## 2019-08-30 HISTORY — DX: Other specified personal risk factors, not elsewhere classified: Z91.89

## 2019-09-11 ENCOUNTER — Other Ambulatory Visit: Payer: Self-pay | Admitting: Family Medicine

## 2019-09-11 ENCOUNTER — Other Ambulatory Visit: Payer: Self-pay

## 2019-09-11 DIAGNOSIS — N644 Mastodynia: Secondary | ICD-10-CM

## 2019-09-17 ENCOUNTER — Telehealth: Payer: Self-pay | Admitting: Obstetrics and Gynecology

## 2019-09-17 ENCOUNTER — Encounter: Payer: Self-pay | Admitting: Obstetrics and Gynecology

## 2019-09-17 ENCOUNTER — Other Ambulatory Visit: Payer: Self-pay

## 2019-09-17 ENCOUNTER — Ambulatory Visit: Payer: 59 | Admitting: Obstetrics and Gynecology

## 2019-09-17 VITALS — BP 104/58 | HR 76 | Temp 96.7°F | Resp 16 | Ht 65.25 in | Wt 135.8 lb

## 2019-09-17 DIAGNOSIS — Z803 Family history of malignant neoplasm of breast: Secondary | ICD-10-CM | POA: Diagnosis not present

## 2019-09-17 DIAGNOSIS — Z01419 Encounter for gynecological examination (general) (routine) without abnormal findings: Secondary | ICD-10-CM | POA: Diagnosis not present

## 2019-09-17 DIAGNOSIS — N644 Mastodynia: Secondary | ICD-10-CM

## 2019-09-17 NOTE — Telephone Encounter (Signed)
Please contact patient to schedule a dx bilateral mammogram and left breast US.   She has left breast pain, and a normal exam.  [facility]

## 2019-09-17 NOTE — Telephone Encounter (Signed)
Order placed for bilateral Dx MMG and left breast US at [facility].   Patient placed in MMG hold.   Spoke with Ena Dawley at [facility]. Patient scheduled for bilateral Dx MMG and left breast US on 10/07/19 at 9:40am, arrive at 9:20am.   Call to patient, left detailed message, ok per dpr, name identified on voicemail. Advised of appt as seen above. Advised if this appt date or time does not work, please contact [place] directly at [phone] to reschedule.   Routing to provider for final review. Patient is agreeable to disposition. Will close encounter.

## 2019-09-17 NOTE — Patient Instructions (Signed)

## 2019-09-17 NOTE — Progress Notes (Signed)
54 y.o. G1P1 Married Caucasian female here for annual exam.   Patient complaining of left breast tenderness.  She has a pulling sensation.  No pain with exertion.   Son had Covid.  She just received her second Covid vaccine.  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   Moved from [place].  PCP:   Grier Mitts, MD  No LMP recorded. Patient has had a hysterectomy.           Sexually active: Yes.    The current method of family planning is post menopausal status.    Exercising: Yes.    jogs 3x/week for 30 minutes. Smoker:  no  Health Maintenance: Pap: 2012 normal per patient History of abnormal Pap:  Yes, ~2000 Hx of LEEP.   FU normal. Thinks she had CIN I.  MMG: 10-04-18  3D/neg/density C/BiRads1 Colonoscopy: 2018 adenomatous polyp;next 5 years BMD: ?2018  Result :normal in [place]: up to date. Gardasil:   no HIV: donates blood Hep C:donates blood Screening Labs:  Today.  Flu vaccine:  Completed.    reports that she has never smoked. She has never used smokeless tobacco. She reports current alcohol use of about 2.0 standard drinks of alcohol per week. She reports that she does not use drugs.  Past Medical History:  Diagnosis Date  . Anemia    with heavy menses  . Endometriosis   . Family history of polyps in the colon   . Genital warts   . History of colon polyps   . History of fainting spells of unknown cause   . Leukopenia   . UTI (urinary tract infection)     Past Surgical History:  Procedure Laterality Date  . ABDOMINAL HYSTERECTOMY  2013   TVH--ovaries remain--[place]    Current Outpatient Medications  Medication Sig Dispense Refill  . Multiple Vitamin (MULTIVITAMIN) tablet Take 1 tablet by mouth daily.     No current facility-administered medications for this visit.    Family History  Problem Relation Age of Onset  . Breast cancer Sister 49  . Breast cancer Mother 41  . Colon polyps Mother   . Hypertension Mother   . Hyperlipidemia Mother   . Heart  disease Maternal Grandmother   . Heart disease Maternal Grandfather   . Heart disease Paternal Grandfather     Review of Systems  All other systems reviewed and are negative.   Exam:   BP (!) 104/58   Pulse 76   Temp (!) 96.7 F (35.9 C) (Temporal)   Resp 16   Ht 5' 5.25" (1.657 m)   Wt 135 lb 12.8 oz (61.6 kg)   BMI 22.43 kg/m     General appearance: alert, cooperative and appears stated age Head: normocephalic, without obvious abnormality, atraumatic Neck: no adenopathy, supple, symmetrical, trachea midline and thyroid normal to inspection and palpation Lungs: clear to auscultation bilaterally Breasts: normal appearance, no masses or tenderness, No nipple retraction or dimpling, No nipple discharge or bleeding, No axillary adenopathy Heart: regular rate and rhythm Abdomen: soft, non-tender; no masses, no organomegaly Extremities: extremities normal, atraumatic, no cyanosis or edema Skin: skin color, texture, turgor normal. No rashes or lesions Lymph nodes: cervical, supraclavicular, and axillary nodes normal. Neurologic: grossly normal  Pelvic: External genitalia:  no lesions              No abnormal inguinal nodes palpated.              Urethra:  normal appearing urethra with no  masses, tenderness or lesions              Bartholins and Skenes: normal                 Vagina: normal appearing vagina with normal color and discharge, no lesions              Cervix: absent              Pap taken: No. Bimanual Exam:  Uterus: absent.              Adnexa: no mass, fullness, tenderness              Rectal exam: Yes.  .  Confirms.              Anus:  normal sphincter tone, no lesions  Chaperone was present for exam.  Assessment:   Well woman visit with normal exam. Hx LEEP.  CIN I? Status post TVH.  Endometriosis. Ovaries remain.  Low WBC.  Left breast tenderness.  FH breast cancer.   Sister and mother.   Sister tested negative for genetic mutation.   Plan: Dx bilateral  mammogram and left breast US.   Self breast awareness reviewed. Pap and HR HPV as above. Guidelines for Calcium, Vitamin D, regular exercise program including cardiovascular and weight bearing exercise. Referral to genetic counseling.  Routine labs.  Will get colposcopy records, LEEP, and surgical report and pathology for her hysterectomy, and her colonoscopy.  Follow up annually and prn.   After visit summary provided.

## 2019-09-18 LAB — COMPREHENSIVE METABOLIC PANEL
ALT: 18 IU/L (ref 0–32)
AST: 19 IU/L (ref 0–40)
Albumin/Globulin Ratio: 1.8 (ref 1.2–2.2)
Albumin: 4.4 g/dL (ref 3.8–4.9)
Alkaline Phosphatase: 73 IU/L (ref 39–117)
BUN/Creatinine Ratio: 12 (ref 9–23)
BUN: 10 mg/dL (ref 6–24)
Bilirubin Total: 0.6 mg/dL (ref 0.0–1.2)
CO2: 26 mmol/L (ref 20–29)
Calcium: 9.9 mg/dL (ref 8.7–10.2)
Chloride: 101 mmol/L (ref 96–106)
Creatinine, Ser: 0.83 mg/dL (ref 0.57–1.00)
GFR calc Af Amer: 92 mL/min/{1.73_m2} (ref >59)
GFR calc non Af Amer: 80 mL/min/{1.73_m2} (ref >59)
Globulin, Total: 2.5 g/dL (ref 1.5–4.5)
Glucose: 81 mg/dL (ref 65–99)
Potassium: 4.2 mmol/L (ref 3.5–5.2)
Sodium: 142 mmol/L (ref 134–144)
Total Protein: 6.9 g/dL (ref 6.0–8.5)

## 2019-09-18 LAB — CBC WITH DIFFERENTIAL/PLATELET
Basophils Absolute: 0.1 10*3/uL (ref 0.0–0.2)
Basos: 1 %
EOS (ABSOLUTE): 0.1 10*3/uL (ref 0.0–0.4)
Eos: 2 %
Hematocrit: 44.5 % (ref 34.0–46.6)
Hemoglobin: 15 g/dL (ref 11.1–15.9)
Immature Grans (Abs): 0 10*3/uL (ref 0.0–0.1)
Immature Granulocytes: 0 %
Lymphocytes Absolute: 1.6 10*3/uL (ref 0.7–3.1)
Lymphs: 27 %
MCH: 30.4 pg (ref 26.6–33.0)
MCHC: 33.7 g/dL (ref 31.5–35.7)
MCV: 90 fL (ref 79–97)
Monocytes Absolute: 0.4 10*3/uL (ref 0.1–0.9)
Monocytes: 7 %
Neutrophils Absolute: 3.6 10*3/uL (ref 1.4–7.0)
Neutrophils: 63 %
Platelets: 182 10*3/uL (ref 150–450)
RBC: 4.94 x10E6/uL (ref 3.77–5.28)
RDW: 12.2 % (ref 11.7–15.4)
WBC: 5.7 10*3/uL (ref 3.4–10.8)

## 2019-09-18 LAB — VITAMIN D 25 HYDROXY (VIT D DEFICIENCY, FRACTURES): Vit D, 25-Hydroxy: 26.2 ng/mL — ABNORMAL LOW (ref 30.0–100.0)

## 2019-09-18 LAB — LIPID PANEL
Chol/HDL Ratio: 2.4 ratio (ref 0.0–4.4)
Cholesterol, Total: 230 mg/dL — ABNORMAL HIGH (ref 100–199)
HDL: 94 mg/dL (ref >39)
LDL Chol Calc (NIH): 127 mg/dL — ABNORMAL HIGH (ref 0–99)
Triglycerides: 52 mg/dL (ref 0–149)
VLDL Cholesterol Cal: 9 mg/dL (ref 5–40)

## 2019-09-20 ENCOUNTER — Telehealth: Payer: Self-pay | Admitting: Licensed Clinical Social Worker

## 2019-09-20 NOTE — Telephone Encounter (Signed)
Received a genetic counseling referral from Dr. Quincy Simmonds fhx of breast cancer. Mercedes Kirby has been scheduled for a mychart video visit w/Brianna on 1/15 at 2pm. I verified her email address, mobile number and ensured she had an active mychart account.

## 2019-09-23 ENCOUNTER — Ambulatory Visit (HOSPITAL_BASED_OUTPATIENT_CLINIC_OR_DEPARTMENT_OTHER): Payer: 59 | Admitting: Licensed Clinical Social Worker

## 2019-09-23 ENCOUNTER — Encounter: Payer: Self-pay | Admitting: Licensed Clinical Social Worker

## 2019-09-23 DIAGNOSIS — Z808 Family history of malignant neoplasm of other organs or systems: Secondary | ICD-10-CM | POA: Diagnosis not present

## 2019-09-23 DIAGNOSIS — Z803 Family history of malignant neoplasm of breast: Secondary | ICD-10-CM | POA: Insufficient documentation

## 2019-09-23 NOTE — Progress Notes (Signed)
REFERRING PROVIDER: Nunzio Cobbs, MD [address] [place],  [place] [address]  PRIMARY PROVIDER:  Billie Ruddy, MD  PRIMARY REASON FOR VISIT:  1. Family history of breast cancer   2. Family history of skin cancer    I connected with Mercedes Kirby on 01/06/1966 at 2:00 PM EDT by Webex and verified that I am speaking with the correct person using two identifiers.    Patient location: home Provider location: clinic   HISTORY OF PRESENT ILLNESS:   Mercedes Kirby, a 54 y.o. female, was seen for a [place] cancer genetics consultation at the request of Dr. Yisroel Ramming due to a family history of cancer.  Mercedes Kirby presents to clinic today to discuss the possibility of a hereditary predisposition to cancer, genetic testing, and to further clarify her future cancer risks, as well as potential cancer risks for family members.    Mercedes Kirby is a 54 y.o. female with no personal history of cancer.    CANCER HISTORY:  Oncology History   No history exists.     RISK FACTORS:  Menarche was at age 80.  First live birth at age 74.  OCP use for approximately 12 years.  Ovaries intact: yes.  Hysterectomy: yes.  Menopausal status: perimenopausal.  HRT use: 0 years. Colonoscopy: yes; reports 1 polyp. Mammogram within the last year: yes. Number of breast biopsies: 0. Up to date with pelvic exams: yes. Any excessive radiation exposure in the past: no  Past Medical History:  Diagnosis Date  . Anemia    with heavy menses  . Endometriosis   . Family history of breast cancer   . Family history of polyps in the colon   . Family history of skin cancer   . Genital warts   . History of colon polyps   . History of fainting spells of unknown cause   . Leukopenia   . UTI (urinary tract infection)     Past Surgical History:  Procedure Laterality Date  . ABDOMINAL HYSTERECTOMY  2013   TVH--ovaries remain--[place]    Social History   Socioeconomic History  .  Marital status: Married    Spouse name: Not on file  . Number of children: Not on file  . Years of education: Not on file  . Highest education level: Not on file  Occupational History  . Not on file  Tobacco Use  . Smoking status: Never Smoker  . Smokeless tobacco: Never Used  Substance and Sexual Activity  . Alcohol use: Yes    Alcohol/week: 2.0 standard drinks    Types: 2 Glasses of wine per week  . Drug use: Never  . Sexual activity: Yes    Birth control/protection: Surgical    Comment: TVH--ovaries remain  Other Topics Concern  . Not on file  Social History Narrative  . Not on file   Social Determinants of Health   Financial Resource Strain:   . Difficulty of Paying Living Expenses: Not on file  Food Insecurity:   . Worried About Charity fundraiser in the Last Year: Not on file  . Ran Out of Food in the Last Year: Not on file  Transportation Needs:   . Lack of Transportation (Medical): Not on file  . Lack of Transportation (Non-Medical): Not on file  Physical Activity:   . Days of Exercise per Week: Not on file  . Minutes of Exercise per Session: Not on file  Stress:   .  Feeling of Stress : Not on file  Social Connections:   . Frequency of Communication with Friends and Family: Not on file  . Frequency of Social Gatherings with Friends and Family: Not on file  . Attends Religious Services: Not on file  . Active Member of Clubs or Organizations: Not on file  . Attends Archivist Meetings: Not on file  . Marital Status: Not on file     FAMILY HISTORY:  We obtained a detailed, 4-generation family history.  Significant diagnoses are listed below: Family History  Problem Relation Age of Onset  . Breast cancer Sister 19  . Breast cancer Mother 29  . Colon polyps Mother   . Hypertension Mother   . Hyperlipidemia Mother   . Heart disease Maternal Grandmother   . Heart disease Maternal Grandfather   . Heart disease Paternal Grandfather   . Cancer  Maternal Uncle        mouth cancer d 77  . Breast cancer Other        one d. 86, one d. 23s  . Skin cancer Maternal Uncle        possibly melanoma  . Breast cancer Cousin    Mercedes Kirby has 1 daughter, 55, no history of cancer. She has 2 sisters. One sister had breast cancer at 89 and is living at 46. She did have genetic testing that she believes showed increased risk for kidney cancer and melanoma, patient unsure exact gene and did not recognize the gene MITF Patietn's other sister is living at 55.  Mercedes Kirby mother was recently diagnosed with breast cancer at 27 and is living at 41. She has also had a few colon polyps, unsure how many. Patient has 4 maternal uncles, 1 maternal aunt. One of her uncles had mouth cancer that was metastatic and died at 59. Another uncle has had various skin cancers, she believes some have been melanomas, removed from face/head. A maternal cousin had breast cancer at 61. Maternal grandmother died at 64. This grandmother had 2 sisters who had breast cancer, one died at 68 and the other in her 46s. Maternal grandfather died at 65.  Mercedes Kirby's father died at 76. He had a twin brother who also died at 44. Patient had another aunt and uncle who were also twins, no cancers. No known cancers in paternal cousins. Paternal grandmother died at 3, grandfather died at 85.  Mercedes Kirby is aware of previous family history of genetic testing for hereditary cancer risks. Patient's maternal ancestors are of [place] descent, and paternal ancestors are of [place] and [place] descent. There is no reported Ashkenazi Jewish ancestry. There is no known consanguinity.  GENETIC COUNSELING ASSESSMENT: Mercedes Kirby is a 54 y.o. female with a family history which is somewhat suggestive of a hereditary cancer syndrome and predisposition to cancer. We, therefore, discussed and recommended the following at today's visit.   DISCUSSION: We discussed that 5 - 10% of breast cancer is hereditary, with most  cases associated with BRCA1/BRCA2 mutations.  There are other genes that can be associated with hereditary breast cancer syndromes. We also briefly discussed the MITF gene since it seems that her sister may have tested positive for this, she believes she can get a copy of the report.   We discussed that testing is beneficial for several reasons including knowing how to follow individuals for cancer screenings and understand if other family members could be at risk for cancer and allow them to undergo genetic testing.  We reviewed the characteristics, features and inheritance patterns of hereditary cancer syndromes. We also discussed genetic testing, including the appropriate family members to test, the process of testing, insurance coverage and turn-around-time for results. We discussed the implications of a negative, positive and/or variant of uncertain significant result. We recommended Mercedes Kirby pursue genetic testing for the Multi-Cancer gene panel.   The Multi-Cancer Panel offered by [facility] includes sequencing and/or deletion duplication testing of the following 85 genes: AIP, ALK, APC, ATM, AXIN2,BAP1,  BARD1, BLM, BMPR1A, BRCA1, BRCA2, BRIP1, CASR, CDC73, CDH1, CDK4, CDKN1B, CDKN1C, CDKN2A (p14ARF), CDKN2A (p16INK4a), CEBPA, CHEK2, CTNNA1, DICER1, DIS3L2, EGFR (c.2369C>T, p.Thr790Met variant only), EPCAM (Deletion/duplication testing only), FH, FLCN, GATA2, GPC3, GREM1 (Promoter region deletion/duplication testing only), HOXB13 (c.251G>A, p.Gly84Glu), HRAS, KIT, MAX, MEN1, MET, MITF (c.952G>A, p.Glu318Lys variant only), MLH1, MSH2, MSH3, MSH6, MUTYH, NBN, NF1, NF2, NTHL1, PALB2, PDGFRA, PHOX2B, PMS2, POLD1, POLE, POT1, PRKAR1A, PTCH1, PTEN, RAD50, RAD51C, RAD51D, RB1, RECQL4, RET, RNF43, RUNX1, SDHAF2, SDHA (sequence changes only), SDHB, SDHC, SDHD, SMAD4, SMARCA4, SMARCB1, SMARCE1, STK11, SUFU, TERC, TERT, TMEM127, TP53, TSC1, TSC2, VHL, WRN and WT1.   Based on Mercedes Kirby's family history of cancer,  she meets medical criteria for genetic testing. Despite that she meets criteria, she may still have an out of pocket cost.   Based on the patient's family history, a statistical model Air cabin crew) was used to estimate her risk of developing breast cancer. This estimates her lifetime risk of developing breast cancer to be approximately 20.1%. This estimation is in the setting of negative test results and may change based on testing.  The patient's lifetime breast cancer risk is a preliminary estimate based on available information using one of several models endorsed by the [place] (ACS). The ACS recommends consideration of breast MRI screening as an adjunct to mammography for patients at high risk (defined as 20% or greater lifetime risk).    Ms. Bai has been determined to be at high risk for breast cancer.  Therefore, we recommend that annual screening with mammography and breast MRI be performed. We discussed that Ms. Millon should discuss her individual situation with her referring physician and determine a breast cancer screening plan with which they are both comfortable.    PLAN: After considering the risks, benefits, and limitations, Mercedes Kirby provided informed consent to pursue genetic testing. A saliva kit will be mailed to her and her sample will be sent to [facility] for analysis of the Multi-Cancer Panel. Results should be available within approximately 2-3 weeks' time, at which point they will be disclosed by telephone to Mercedes Kirby, as will any additional recommendations warranted by these results. Mercedes Kirby will receive a summary of her genetic counseling visit and a copy of her results once available. This information will also be available in Epic.   Mercedes Kirby questions were answered to her satisfaction today. Our contact information was provided should additional questions or concerns arise. Thank you for the referral and allowing [place] to share in the care of  your patient.   Faith Rogue, MS, [facility] Genetic Counselor [email or web address] Phone: [phone]  The patient was seen for a total of 35 minutes in virtual genetic counseling.  Drs. Magrinat, Lindi Adie and/or Burr Medico were available for discussion regarding this case.   _______________________________________________________________________ For Office Staff:  Number of people involved in session: 1 Was an Intern/ student involved with case: no

## 2019-09-25 ENCOUNTER — Encounter: Payer: Self-pay | Admitting: Obstetrics and Gynecology

## 2019-09-25 DIAGNOSIS — Z803 Family history of malignant neoplasm of breast: Secondary | ICD-10-CM | POA: Diagnosis not present

## 2019-10-07 ENCOUNTER — Other Ambulatory Visit: Payer: Self-pay

## 2019-10-07 ENCOUNTER — Ambulatory Visit: Payer: 59

## 2019-10-07 ENCOUNTER — Other Ambulatory Visit: Payer: 59

## 2019-10-07 ENCOUNTER — Ambulatory Visit
Admission: RE | Admit: 2019-10-07 | Discharge: 2019-10-07 | Disposition: A | Discharge status: Home / Self Care | Payer: 59 | Source: Ambulatory Visit | Attending: Obstetrics and Gynecology | Admitting: Obstetrics and Gynecology

## 2019-10-07 DIAGNOSIS — N644 Mastodynia: Secondary | ICD-10-CM

## 2019-10-07 DIAGNOSIS — Z803 Family history of malignant neoplasm of breast: Secondary | ICD-10-CM

## 2019-10-07 DIAGNOSIS — R921 Mammographic calcification found on diagnostic imaging of breast: Secondary | ICD-10-CM | POA: Diagnosis not present

## 2019-10-09 ENCOUNTER — Ambulatory Visit: Payer: Self-pay | Admitting: Licensed Clinical Social Worker

## 2019-10-09 ENCOUNTER — Telehealth: Payer: Self-pay | Admitting: Licensed Clinical Social Worker

## 2019-10-09 DIAGNOSIS — Z1379 Encounter for other screening for genetic and chromosomal anomalies: Secondary | ICD-10-CM | POA: Insufficient documentation

## 2019-10-09 DIAGNOSIS — Z808 Family history of malignant neoplasm of other organs or systems: Secondary | ICD-10-CM

## 2019-10-09 DIAGNOSIS — Z86010 Personal history of colonic polyps: Secondary | ICD-10-CM

## 2019-10-09 DIAGNOSIS — Z803 Family history of malignant neoplasm of breast: Secondary | ICD-10-CM

## 2019-10-09 NOTE — Telephone Encounter (Signed)
Revealed negative genetic testing.  This normal result is reassuring.  It is unlikely that there is an increased risk of cancer due to a mutation in one of these genes.  However, genetic testing is not perfect, and cannot definitively rule out a hereditary cause.  It will be important for her to keep in contact with genetics to learn if any additional testing may be needed in the future.      

## 2019-10-09 NOTE — Progress Notes (Signed)
HPI:  [name] was previously seen in the [place] clinic due to a family history of cancer and concerns regarding a hereditary predisposition to cancer. Please refer to our prior cancer genetics clinic note for more information regarding our discussion, assessment and recommendations, at the time. Mercedes Kirby recent genetic test results were disclosed to her, as were recommendations warranted by these results. These results and recommendations are discussed in more detail below.  CANCER HISTORY:  Oncology History   No history exists.    FAMILY HISTORY:  We obtained a detailed, 4-generation family history.  Significant diagnoses are listed below: Family History  Problem Relation Age of Onset  . Breast cancer Sister 63  . Breast cancer Mother 47  . Colon polyps Mother   . Hypertension Mother   . Hyperlipidemia Mother   . Heart disease Maternal Grandmother   . Heart disease Maternal Grandfather   . Heart disease Paternal Grandfather   . Cancer Maternal Uncle        mouth cancer d 83  . Breast cancer Other        one d. 28, one d. 3s  . Skin cancer Maternal Uncle        possibly melanoma  . Breast cancer Cousin     Mercedes Kirby has 1 daughter, 73, no history of cancer. She has 2 sisters. One sister had breast cancer at 7 and is living at 80. She did have genetic testing that she believes showed increased risk for kidney cancer and melanoma, patient unsure exact gene and did not recognize the gene MITF Patietn's other sister is living at 43.  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 She will send a copy of this to me for review.   Mercedes Kirby mother was recently diagnosed with breast cancer at 29 and is living at 49. She has also had a few colon polyps, unsure how many. Patient has 4 maternal uncles, 1 maternal aunt. One of her uncles had mouth cancer that was metastatic and died at 66. Another uncle has  had various skin cancers, she believes some have been melanomas, removed from face/head. A maternal cousin had breast cancer at 13. Maternal grandmother died at 75. This grandmother had 2 sisters who had breast cancer, one died at 33 and the other in her 32s. Maternal grandfather died at 82.  Mercedes Kirby's father died at 66. He had a twin brother who also died at 66. Patient had another aunt and uncle who were also twins, no cancers. No known cancers in paternal cousins. Paternal grandmother died at 49, grandfather died at 37.  Mercedes Kirby is aware of previous family history of genetic testing for hereditary cancer risks. Patient's maternal ancestors are of [place] descent, and paternal ancestors are of [place] and [place] descent. There is no reported Ashkenazi Jewish ancestry. There is no known consanguinity.  GENETIC TEST RESULTS: Genetic testing reported out on 10/09/2019 through the [facility] Multi- cancer panel found no pathogenic mutations.   The Multi-Cancer Panel offered by [facility] includes sequencing and/or deletion duplication testing of the following 85 genes: AIP, ALK, APC, ATM, AXIN2,BAP1,  BARD1, BLM, BMPR1A, BRCA1, BRCA2, BRIP1, CASR, CDC73, CDH1, CDK4, CDKN1B, CDKN1C, CDKN2A (p14ARF), CDKN2A (p16INK4a), CEBPA, CHEK2, CTNNA1, DICER1, DIS3L2, EGFR (c.2369C>T, p.Thr790Met variant only), EPCAM (Deletion/duplication testing only), FH, FLCN, GATA2, GPC3, GREM1 (Promoter region deletion/duplication testing only), HOXB13 (c.251G>A, p.Gly84Glu), HRAS, KIT, MAX, MEN1, MET, MITF (c.952G>A, p.Glu318Lys variant  only), MLH1, MSH2, MSH3, MSH6, MUTYH, NBN, NF1, NF2, NTHL1, PALB2, PDGFRA, PHOX2B, PMS2, POLD1, POLE, POT1, PRKAR1A, PTCH1, PTEN, RAD50, RAD51C, RAD51D, RB1, RECQL4, RET, RNF43, RUNX1, SDHAF2, SDHA (sequence changes only), SDHB, SDHC, SDHD, SMAD4, SMARCA4, SMARCB1, SMARCE1, STK11, SUFU, TERC, TERT, TMEM127, TP53, TSC1, TSC2, VHL, WRN and WT1.   The test report has been scanned into EPIC and is located  under the Molecular Pathology section of the Results Review tab.  A portion of the result report is included below for reference.     We discussed with Ms. Ouellet that because current genetic testing is not perfect, it is possible there may be a gene mutation in one of these genes that current testing cannot detect, but that chance is small.  We also discussed, that there could be another gene that has not yet been discovered, or that we have not yet tested, that is responsible for the cancer diagnoses in the family. It is also possible there is a hereditary cause for the cancer in the family that Ms. Chojnowski did not inherit and therefore was not identified in her testing.  Therefore, it is important to remain in touch with cancer genetics in the future so that we can continue to offer Ms. Eliasen the most up to date genetic testing.   ADDITIONAL GENETIC TESTING:We discussed with Ms. Shiffman that her genetic testing was fairly extensive.  If there are genes identified to increase cancer risk that can be analyzed in the future, we would be happy to discuss and coordinate this testing at that time.    CANCER SCREENING RECOMMENDATIONS: Ms. Melder test result is considered negative (normal).  This means that we have not identified a hereditary cause for her  family history of cancer at this time.   While reassuring, this does not definitively rule out a hereditary predisposition to cancer. It is still possible that there could be genetic mutations that are undetectable by current technology. There could be genetic mutations in genes that have not been tested or identified to increase cancer risk.  Therefore, it is recommended she continue to follow the cancer management and screening guidelines provided by her primary healthcare provider.   An individual's cancer risk and medical management are not determined by genetic test results alone. Overall cancer risk assessment incorporates additional factors, including  personal medical history, family history, and any available genetic information that may result in a personalized plan for cancer prevention and surveillance.  Based on the patient's family history, a statistical model Air cabin crew) was used to estimate her risk of developing breast cancer. This estimates her lifetime risk of developing breast cancer to be approximately 20.1%. This estimation takes into account her negative genetic test results.  The patient's lifetime breast cancer risk is a preliminary estimate based on available information using one of several models endorsed by the [place] (ACS). The ACS recommends consideration of breast MRI screening as an adjunct to mammography for patients at high risk (defined as 20% or greater lifetime risk).    Ms. Modisette has been determined to be at high risk for breast cancer. Therefore, we recommend that annual screening with mammography and breast MRI be performed. We discussed that Ms. Lievanos should discuss her individual situation with her referring physician and determine a breast cancer screening plan with which they are both comfortable.Ms. Samad also reports she discussed high risk screening with Dr. Jimmye Norman recently and he also recommends this plan.   RECOMMENDATIONS FOR FAMILY MEMBERS:  Relatives  in this family might be at some increased risk of developing cancer, over the general population risk, simply due to the family history of cancer.  We recommended female relatives in this family have a yearly mammogram beginning at age 31, or 60 years younger than the earliest onset of cancer, an annual clinical breast exam, and perform monthly breast self-exams. Female relatives in this family should also have a gynecological exam as recommended by their primary provider. All family members should have a colonoscopy by age 57, or as directed by their physicians.   It is also possible there is a hereditary cause for the cancer in Ms.  Stagner's family that she did not inherit and therefore was not identified in her.  Based on Ms. 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  Ms. Farabee will let us know if we can be of any assistance in coordinating genetic counseling and/or testing for these family members.  FOLLOW-UP: Lastly, we discussed with Ms. Chisholm that cancer genetics is a rapidly advancing field and it is possible that new genetic tests will be appropriate for her and/or her family members in the future. We encouraged her to remain in contact with cancer genetics on an annual basis so we can update her personal and family histories and let her know of advances in cancer genetics that may benefit this family.   Our contact number was provided. Ms. Fifer questions were answered to her satisfaction, and she knows she is welcome to call us at anytime with additional questions or concerns.   Faith Rogue, MS, [facility] Genetic Counselor [email or web address] Phone: [phone]

## 2019-12-25 ENCOUNTER — Telehealth: Payer: Self-pay | Admitting: Obstetrics and Gynecology

## 2019-12-25 DIAGNOSIS — Z9189 Other specified personal risk factors, not elsewhere classified: Secondary | ICD-10-CM

## 2019-12-25 DIAGNOSIS — Z803 Family history of malignant neoplasm of breast: Secondary | ICD-10-CM

## 2019-12-25 NOTE — Telephone Encounter (Signed)
New AEX 09/17/19   Spoke with pt. Pt requesting breast MRI referral.   Pt reports having left breast pain x 6 months now . Denies nipple discharge, breast skin changes, redness, fever, lumps. Pt states is painful when lying in bed on left side. Pt states in menopause now with no HRT.  Has no current hormone labs.   Advised pt to be seen for further evaluation before breast MRI referral. Pt declines.  Pt has Family H/O of breast cancer with multiple women in family. See AEX notes.    Pt has lifetime risk of 20.1 % and has recommendation per genetics notes:  The ACS recommends consideration of breast MRI screening as an adjunct to mammography for patients at high risk (defined as 20% or greater lifetime risk).  Pt had last Dx bilateral MMG on 10/07/19 Birads 2, benign.   Pt requesting referral for Breast MRI with Dr Dorise Bullion at [facility] if possible.   Routing to Dr Quincy Simmonds for review and any additional recommendations.

## 2019-12-25 NOTE — Telephone Encounter (Signed)
Patient would like a referral for a breast MRI.

## 2019-12-25 NOTE — Telephone Encounter (Signed)
Patient had a dx mammogram in Feb. 2021 due to left breast pain.   I do recommend bilateral MRI of the breast with contrast due to her increased risk of breast cancer.  Please order this.

## 2019-12-26 NOTE — Telephone Encounter (Signed)
Patient is returning call.  °

## 2019-12-26 NOTE — Telephone Encounter (Signed)
Spoke with pt. Pt updated with orders placed for breast MRI. Pt informed that [place] will call her to schedule. Pt agreeable and verbalized understanding.   Routing to Dr Quincy Simmonds for review.  Encounter closed.

## 2019-12-26 NOTE — Telephone Encounter (Signed)
Left message for pt to return call to triage RN.  Bil Breast MRI orders placed per Dr Quincy Simmonds.

## 2020-01-18 ENCOUNTER — Ambulatory Visit
Admission: RE | Admit: 2020-01-18 | Discharge: 2020-01-18 | Disposition: A | Discharge status: Home / Self Care | Payer: 59 | Source: Ambulatory Visit | Attending: Obstetrics and Gynecology | Admitting: Obstetrics and Gynecology

## 2020-01-18 DIAGNOSIS — Z9189 Other specified personal risk factors, not elsewhere classified: Secondary | ICD-10-CM

## 2020-01-18 DIAGNOSIS — N6489 Other specified disorders of breast: Secondary | ICD-10-CM | POA: Diagnosis not present

## 2020-01-18 DIAGNOSIS — Z803 Family history of malignant neoplasm of breast: Secondary | ICD-10-CM

## 2020-01-18 MED ORDER — GADOBUTROL 1 MMOL/ML IV SOLN
6.0000 mL | Freq: Once | INTRAVENOUS | Status: AC | PRN
  Administered 2020-01-18: 6 mL via INTRAVENOUS

## 2020-08-24 ENCOUNTER — Other Ambulatory Visit: Payer: Self-pay | Admitting: Obstetrics and Gynecology

## 2020-08-24 DIAGNOSIS — Z1231 Encounter for screening mammogram for malignant neoplasm of breast: Secondary | ICD-10-CM

## 2020-09-29 DIAGNOSIS — U071 COVID-19: Secondary | ICD-10-CM

## 2020-09-29 HISTORY — DX: COVID-19: U07.1

## 2020-10-07 ENCOUNTER — Inpatient Hospital Stay: Admission: RE | Admit: 2020-10-07 | Payer: 59 | Source: Ambulatory Visit

## 2020-10-15 ENCOUNTER — Other Ambulatory Visit: Payer: Self-pay

## 2020-10-15 ENCOUNTER — Ambulatory Visit (INDEPENDENT_AMBULATORY_CARE_PROVIDER_SITE_OTHER): Payer: 59 | Admitting: Obstetrics and Gynecology

## 2020-10-15 ENCOUNTER — Encounter: Payer: Self-pay | Admitting: Obstetrics and Gynecology

## 2020-10-15 VITALS — BP 118/72 | HR 72 | Resp 14 | Ht 65.0 in | Wt 135.0 lb

## 2020-10-15 DIAGNOSIS — Z01419 Encounter for gynecological examination (general) (routine) without abnormal findings: Secondary | ICD-10-CM

## 2020-10-15 DIAGNOSIS — D72819 Decreased white blood cell count, unspecified: Secondary | ICD-10-CM

## 2020-10-15 DIAGNOSIS — R7989 Other specified abnormal findings of blood chemistry: Secondary | ICD-10-CM | POA: Diagnosis not present

## 2020-10-15 DIAGNOSIS — Z1211 Encounter for screening for malignant neoplasm of colon: Secondary | ICD-10-CM

## 2020-10-15 DIAGNOSIS — Z9189 Other specified personal risk factors, not elsewhere classified: Secondary | ICD-10-CM

## 2020-10-15 NOTE — Progress Notes (Signed)
55 y.o. G1P1 Married Caucasian female here for annual exam.    Has done some traveling to [place] in the last year.  Received Covid booster, flu vaccine. Received her Shingrix and pneumonia vaccines in 2021.   PCP:   Grier Mitts, MD  No LMP recorded. Patient has had a hysterectomy.           Sexually active: Yes.    The current method of family planning is status post hysterectomy.    Exercising: Yes.    personal training, running Smoker:  no  Health Maintenance: Pap:  2012 normal per patient History of abnormal Pap:  yes MMG:  10-07-19 density C/BirADS 2 benign, scheduled 11-25-20 Colonoscopy: 07-15-16 polyps, f/u 5 years BMD:   11-14-16  Result  Normal  TDaP:  UTD  Gardasil:   no HIV: donates blood Hep C: donates blood Screening Labs:  Hb today: discuss with provider, Urine today: not collected   reports that she has never smoked. She has never used smokeless tobacco. She reports current alcohol use of about 2.0 standard drinks of alcohol per week. She reports that she does not use drugs.  Past Medical History:  Diagnosis Date  . Anemia    with heavy menses  . Endometriosis   . Family history of breast cancer   . Family history of polyps in the colon   . Family history of skin cancer   . Genital warts   . History of colon polyps   . History of fainting spells of unknown cause   . Increased risk of breast cancer 2021  . Leukopenia   . UTI (urinary tract infection)     Past Surgical History:  Procedure Laterality Date  . ABDOMINAL HYSTERECTOMY  2013   TVH--ovaries remain--[place]    Current Outpatient Medications  Medication Sig Dispense Refill  . Multiple Vitamin (MULTIVITAMIN) tablet Take 1 tablet by mouth daily.     No current facility-administered medications for this visit.    Family History  Problem Relation Age of Onset  . Breast cancer Sister 37  . Breast cancer Mother 7  . Colon polyps Mother   . Hypertension Mother   . Hyperlipidemia  Mother   . Heart disease Maternal Grandmother   . Heart disease Maternal Grandfather   . Heart disease Paternal Grandfather   . Cancer Maternal Uncle        mouth cancer d 31  . Breast cancer Other        one d. 59, one d. 61s  . Skin cancer Maternal Uncle        possibly melanoma  . Breast cancer Cousin     Review of Systems  All other systems reviewed and are negative.   Exam:   BP 118/72 (BP Location: Right Arm, Patient Position: Sitting, Cuff Size: Normal)   Pulse 72   Resp 14   Ht 5\' 5"  (1.651 m)   Wt 135 lb (61.2 kg)   BMI 22.47 kg/m     General appearance: alert, cooperative and appears stated age Head: normocephalic, without obvious abnormality, atraumatic Neck: no adenopathy, supple, symmetrical, trachea midline and thyroid normal to inspection and palpation Lungs: clear to auscultation bilaterally Breasts: normal appearance, no masses or tenderness, No nipple retraction or dimpling, No nipple discharge or bleeding, No axillary adenopathy Heart: regular rate and rhythm Abdomen: soft, non-tender; no masses, no organomegaly Extremities: extremities normal, atraumatic, no cyanosis or edema Skin: skin color, texture, turgor normal. No rashes or lesions Lymph  nodes: cervical, supraclavicular, and axillary nodes normal. Neurologic: grossly normal  Pelvic: External genitalia:  no lesions              No abnormal inguinal nodes palpated.              Urethra:  normal appearing urethra with no masses, tenderness or lesions              Bartholins and Skenes: normal                 Vagina: normal appearing vagina with normal color and discharge, no lesions              Cervix: absent              Pap taken: No. Bimanual Exam:  Uterus:  absent              Adnexa: no mass, fullness, tenderness              Rectal exam: Yes.  .  Confirms.              Anus:  normal sphincter tone, no lesions  Chaperone was present for exam.  Assessment:   Well woman visit with normal  exam. Hx LEEP.  CIN I? Status post TVH.  Endometriosis. Ovaries remain.  Low vit D.  FH breast cancer.   Sister and mother.   Sister tested negative for genetic mutation.  Patient tested negative for genetic mutations.   Plan: Mammogram screening discussed. Breast MRI ordered.  Self breast awareness reviewed. Pap and HR HPV as above. Guidelines for Calcium, Vitamin D, regular exercise program including cardiovascular and weight bearing exercise. Routine labs.  Referral to GI for colonoscopy.  Patient will get a copy of her operative report and pathology from her hysterectomy.  We have not been able to get a copy of this to date.  Follow up annually and prn.

## 2020-10-15 NOTE — Patient Instructions (Signed)

## 2020-10-16 LAB — COMPREHENSIVE METABOLIC PANEL
AG Ratio: 2 (calc) (ref 1.0–2.5)
ALT: 41 U/L — ABNORMAL HIGH (ref 6–29)
AST: 40 U/L — ABNORMAL HIGH (ref 10–35)
Albumin: 4.5 g/dL (ref 3.6–5.1)
Alkaline phosphatase (APISO): 58 U/L (ref 37–153)
BUN: 15 mg/dL (ref 7–25)
CO2: 30 mmol/L (ref 20–32)
Calcium: 9.5 mg/dL (ref 8.6–10.4)
Chloride: 104 mmol/L (ref 98–110)
Creat: 0.75 mg/dL (ref 0.50–1.05)
Globulin: 2.2 g/dL (calc) (ref 1.9–3.7)
Glucose, Bld: 83 mg/dL (ref 65–99)
Potassium: 3.9 mmol/L (ref 3.5–5.3)
Sodium: 140 mmol/L (ref 135–146)
Total Bilirubin: 0.6 mg/dL (ref 0.2–1.2)
Total Protein: 6.7 g/dL (ref 6.1–8.1)

## 2020-10-16 LAB — CBC
HCT: 42.4 % (ref 35.0–45.0)
Hemoglobin: 14.3 g/dL (ref 11.7–15.5)
MCH: 31 pg (ref 27.0–33.0)
MCHC: 33.7 g/dL (ref 32.0–36.0)
MCV: 92 fL (ref 80.0–100.0)
MPV: 14.3 fL — ABNORMAL HIGH (ref 7.5–12.5)
Platelets: 182 10*3/uL (ref 140–400)
RBC: 4.61 10*6/uL (ref 3.80–5.10)
RDW: 11.9 % (ref 11.0–15.0)
WBC: 3.7 10*3/uL — ABNORMAL LOW (ref 3.8–10.8)

## 2020-10-16 LAB — LIPID PANEL
Cholesterol: 229 mg/dL — ABNORMAL HIGH (ref <200)
HDL: 84 mg/dL (ref >=50)
LDL Cholesterol (Calc): 130 mg/dL (calc) — ABNORMAL HIGH
Non-HDL Cholesterol (Calc): 145 mg/dL (calc) — ABNORMAL HIGH (ref <130)
Total CHOL/HDL Ratio: 2.7 (calc) (ref <5.0)
Triglycerides: 49 mg/dL (ref <150)

## 2020-10-16 LAB — VITAMIN D 25 HYDROXY (VIT D DEFICIENCY, FRACTURES): Vit D, 25-Hydroxy: 18 ng/mL — ABNORMAL LOW (ref 30–100)

## 2020-10-19 NOTE — Addendum Note (Signed)
Addended by: Yisroel Ramming, Dietrich Pates E on: 10/19/2020 05:55 AM   Modules accepted: Orders

## 2020-10-21 ENCOUNTER — Other Ambulatory Visit: Payer: Self-pay

## 2020-10-21 MED ORDER — VITAMIN D (ERGOCALCIFEROL) 1.25 MG (50000 UNIT) PO CAPS: ORAL_CAPSULE | ORAL | 0 refills | Status: DC

## 2020-10-22 ENCOUNTER — Encounter: Payer: Self-pay | Admitting: Internal Medicine

## 2020-10-22 ENCOUNTER — Telehealth: Payer: Self-pay | Admitting: Internal Medicine

## 2020-10-22 NOTE — Telephone Encounter (Signed)
Mercedes Kirby please let the patient know I reviewed her records.  The timing of colonoscopy repeat was appropriate then but since then there have been expert guideline changes so that the patient does not need a routine repeat colonoscopy until around November 2024.  Please place that recall.  I have updated health maintenance.  Make sure to explain to the patient that this is the case assuming she is not having any bowel habit changes or bleeding or other issues.  If she is she should make an appointment to see me.  I have copied her PCP on this.

## 2020-10-22 NOTE — Telephone Encounter (Signed)
OK to use a follow-up slot for her to be seen earlier

## 2020-10-22 NOTE — Telephone Encounter (Addendum)
Hi Dr. Carlean Purl,   We received a referral from GYN for patient to have a repeat colonoscopy. Obtained records from 2017 for you to review.   Please advise on scheduling.  Thank you

## 2020-10-22 NOTE — Telephone Encounter (Signed)
Dr. Carlean Purl there was an error in the previous message the referral is from the patient's GYN provider Dr. Yisroel Ramming.  I called patient to advise and she mentioned recent lab work that was done which shows abnormal liver levels that she would like to address with you.  Your next availability is in May would you recommend her coming in sooner for that?

## 2020-11-09 ENCOUNTER — Other Ambulatory Visit: Payer: Self-pay

## 2020-11-09 ENCOUNTER — Ambulatory Visit (INDEPENDENT_AMBULATORY_CARE_PROVIDER_SITE_OTHER): Payer: 59 | Admitting: Internal Medicine

## 2020-11-09 ENCOUNTER — Encounter: Payer: Self-pay | Admitting: Internal Medicine

## 2020-11-09 ENCOUNTER — Other Ambulatory Visit (INDEPENDENT_AMBULATORY_CARE_PROVIDER_SITE_OTHER): Payer: 59

## 2020-11-09 VITALS — BP 112/64 | HR 82 | Ht 66.0 in | Wt 136.0 lb

## 2020-11-09 DIAGNOSIS — R748 Abnormal levels of other serum enzymes: Secondary | ICD-10-CM | POA: Diagnosis not present

## 2020-11-09 DIAGNOSIS — D72819 Decreased white blood cell count, unspecified: Secondary | ICD-10-CM

## 2020-11-09 DIAGNOSIS — Z86010 Personal history of colonic polyps: Secondary | ICD-10-CM | POA: Diagnosis not present

## 2020-11-09 LAB — CBC WITH DIFFERENTIAL/PLATELET
Basophils Absolute: 0 10*3/uL (ref 0.0–0.1)
Basophils Relative: 0.9 % (ref 0.0–3.0)
Eosinophils Absolute: 0.1 10*3/uL (ref 0.0–0.7)
Eosinophils Relative: 1.7 % (ref 0.0–5.0)
HCT: 41.7 % (ref 36.0–46.0)
Hemoglobin: 14.2 g/dL (ref 12.0–15.0)
Lymphocytes Relative: 28.6 % (ref 12.0–46.0)
Lymphs Abs: 1.4 10*3/uL (ref 0.7–4.0)
MCHC: 34 g/dL (ref 30.0–36.0)
MCV: 90.8 fl (ref 78.0–100.0)
Monocytes Absolute: 0.3 10*3/uL (ref 0.1–1.0)
Monocytes Relative: 6 % (ref 3.0–12.0)
Neutro Abs: 3.1 10*3/uL (ref 1.4–7.7)
Neutrophils Relative %: 62.8 % (ref 43.0–77.0)
Platelets: 160 10*3/uL (ref 150.0–400.0)
RBC: 4.6 Mil/uL (ref 3.87–5.11)
RDW: 12.7 % (ref 11.5–15.5)
WBC: 4.9 10*3/uL (ref 4.0–10.5)

## 2020-11-09 LAB — CK: Total CK: 70 U/L (ref 7–177)

## 2020-11-09 LAB — HEPATIC FUNCTION PANEL
ALT: 21 U/L (ref 0–35)
AST: 17 U/L (ref 0–37)
Albumin: 4.4 g/dL (ref 3.5–5.2)
Alkaline Phosphatase: 63 U/L (ref 39–117)
Bilirubin, Direct: 0.1 mg/dL (ref 0.0–0.3)
Total Bilirubin: 0.6 mg/dL (ref 0.2–1.2)
Total Protein: 7.1 g/dL (ref 6.0–8.3)

## 2020-11-09 NOTE — Patient Instructions (Signed)
Your provider has requested that you go to the basement level for lab work before leaving today. Press "B" on the elevator. The lab is located at the first door on the left as you exit the elevator.  Due to recent changes in healthcare laws, you may see the results of your imaging and laboratory studies on MyChart before your provider has had a chance to review them.  We understand that in some cases there may be results that are confusing or concerning to you. Not all laboratory results come back in the same time frame and the provider may be waiting for multiple results in order to interpret others.  Please give [place] 48 hours in order for your provider to thoroughly review all the results before contacting the office for clarification of your results.   We will put you into our system for a November 2024 colonoscopy recall.   I appreciate the opportunity to care for you. Silvano Rusk, MD, [facility]

## 2020-11-09 NOTE — Progress Notes (Signed)
Mercedes Kirby 55 y.o. Jul 02, 1966 [id number] Referred by: Dr. Josefa Half  Assessment & Plan:   Encounter Diagnoses  Name Primary?  . Abnormal transaminases Yes  . Leukopenia, unspecified type   . Hx of adenomatous polyp of colon-2017     I think it is quite possible her abnormal transaminases and mild leukopenia could have been related to her Covid infection.  The labs were drawn on February 17 so they were drawn a day after she had her weekly exercise with a trainer as well.  So that could have had some effect.  I.e. a musculoskeletal source is possible.  She is quite healthy overall my overall index of suspicion of significant liver problems is low but it merits recheck of the liver tests.  She has this mild leukopenia which could be from the virus as well she had it 2 years ago as well though it is almost normal so probably clinically insignificant.  We will recheck CBC also.  She had a diminutive adenoma in 2017 and current guidelines would say repeat a colonoscopy as early as 7 and as late as 10 years.  When she had her colonoscopy in 2017 we were recalling people at early as 5 years so this is a change.  I have explained this to her and she is accepting of this.  Orders Placed This Encounter  Procedures  . CK (Creatine Kinase)  . Hepatic function panel  . CBC w/Diff    I appreciate the opportunity to care for this patient. Copy to Dr. Josefa Half    Subjective:   Chief Complaint: Abnormal transaminases  HPI Mercedes Kirby is a 55 year old married white woman with abnormal transaminases, referred by Dr. Quincy Simmonds for evaluation.  Patient reports that a couple of weeks or so after having Covid infection she had these labs drawn and her transaminases were elevated.  No history of this.  No significant alcohol use no prior history of liver disease.  She did have a fair amount of myalgias with the Covid and she also exercises but cannot recall if she exercised on that date.  She does  work out with a Clinical research associate every Wednesday.  She also had a mildly low white blood cell count and wonders what that might be related to.  She has had that in the past as well the last year her white count was normal.  She has a history of a 4 mm adenoma removed a screening colonoscopy in [place] in November 2017.  She is not having any gastrointestinal or liver symptoms at this time.  There is no significant family history of liver disease.  Lab Results  Component Value Date   ALT 41 (H) 10/15/2020   AST 40 (H) 10/15/2020   ALKPHOS 73 09/17/2019   BILITOT 0.6 10/15/2020   CBC Latest Ref Rng & Units 10/15/2020 09/17/2019 09/12/2018  WBC 3.8 - 10.8 Thousand/uL 3.7(L) 5.7 3.5(L)  Hemoglobin 11.7 - 15.5 g/dL 14.3 15.0 15.1(H)  Hematocrit 35.0 - 45.0 % 42.4 44.5 44.4  Platelets 140 - 400 Thousand/uL 182 182 163.0    Allergies  Allergen Reactions  . Codeine Other (See Comments)    Pass out--breaks out in a sweat   Current Meds  Medication Sig  . Multiple Vitamin (MULTIVITAMIN) tablet Take 1 tablet by mouth daily.  . Vitamin D, Ergocalciferol, (DRISDOL) 1.25 MG (50000 UNIT) CAPS capsule Take one caps po every two weeks.   Past Medical History:  Diagnosis Date  . Anemia  with heavy menses  . COVID-19 09/2020  . Endometriosis   . Family history of breast cancer   . Family history of polyps in the colon   . Family history of skin cancer   . Genital warts   . History of fainting spells of unknown cause   . Hx of adenomatous polyp of colon 07/15/2016  . Increased risk of breast cancer 2021  . Leukopenia   . UTI (urinary tract infection)    Past Surgical History:  Procedure Laterality Date  . COLONOSCOPY W/ POLYPECTOMY  06/2016   Diminutive adenoma  . VAGINAL HYSTERECTOMY  2013   has her ovaries-done in [place] History Narrative   Married 1 daughter   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[facility]   Has lived in [place] and now [place],  husband works for a [employer] as a Engineer, building services (Artist)   Never smoker 2-3 alcoholic drinks a week 1 caffeinated beverage daily no tobacco no drug use   family history includes Breast cancer in her cousin and another family member; Breast cancer (age of onset: 98) in her sister; Breast cancer (age of onset: 72) in her mother; Cancer in her maternal uncle; Colon polyps in her mother; Heart disease in her maternal grandfather, maternal grandmother, and paternal grandfather; Hyperlipidemia in her mother; Hypertension in her mother; Skin cancer in her maternal uncle.   Review of Systems All other review of systems are negative  Objective:   Physical Exam BP 112/64   Pulse 82   Ht 5\' 6"  (1.676 m)   Wt 136 lb (61.7 kg)   BMI 21.95 kg/m  Well-developed well-nourished white woman in no acute distress Eyes anicteric Lungs are clear Heart sounds are normal S1-S2 no rubs murmurs or gallops The abdomen is soft scaphoid nontender without hepatosplenomegaly or mass or hernia She is alert and oriented x3 She has an appropriate mood and affect

## 2020-11-26 ENCOUNTER — Ambulatory Visit
Admission: RE | Admit: 2020-11-26 | Discharge: 2020-11-26 | Disposition: A | Discharge status: Home / Self Care | Payer: 59 | Source: Ambulatory Visit | Attending: Obstetrics and Gynecology | Admitting: Obstetrics and Gynecology

## 2020-11-26 ENCOUNTER — Ambulatory Visit: Payer: 59

## 2020-11-26 ENCOUNTER — Other Ambulatory Visit: Payer: Self-pay

## 2020-11-26 DIAGNOSIS — Z1231 Encounter for screening mammogram for malignant neoplasm of breast: Secondary | ICD-10-CM

## 2020-12-07 ENCOUNTER — Encounter: Payer: Self-pay | Admitting: Obstetrics and Gynecology

## 2020-12-08 ENCOUNTER — Encounter: Payer: Self-pay | Admitting: Obstetrics and Gynecology

## 2020-12-29 ENCOUNTER — Ambulatory Visit: Payer: 59 | Admitting: Internal Medicine

## 2021-01-15 ENCOUNTER — Other Ambulatory Visit: Payer: 59

## 2021-01-15 ENCOUNTER — Ambulatory Visit
Admission: RE | Admit: 2021-01-15 | Discharge: 2021-01-15 | Disposition: A | Discharge status: Home / Self Care | Payer: 59 | Source: Ambulatory Visit | Attending: Obstetrics and Gynecology | Admitting: Obstetrics and Gynecology

## 2021-01-15 DIAGNOSIS — Z9189 Other specified personal risk factors, not elsewhere classified: Secondary | ICD-10-CM

## 2021-01-15 DIAGNOSIS — R921 Mammographic calcification found on diagnostic imaging of breast: Secondary | ICD-10-CM | POA: Diagnosis not present

## 2021-01-15 DIAGNOSIS — Z1239 Encounter for other screening for malignant neoplasm of breast: Secondary | ICD-10-CM | POA: Diagnosis not present

## 2021-01-15 MED ORDER — GADOBUTROL 1 MMOL/ML IV SOLN
6.0000 mL | Freq: Once | INTRAVENOUS | Status: AC | PRN
  Administered 2021-01-15: 6 mL via INTRAVENOUS

## 2021-01-17 IMAGING — MG DIGITAL DIAGNOSTIC BILAT W/ TOMO W/ CAD
8 of 16 series · 8 of 36 positions shown · non-contrast
Comparison: Previous exam(s).

CLINICAL DATA: Pulling sensation in the left breast with no focal
lump. Family history of breast cancer including an aunt and mother.
The aunt was diagnosed at the age of 47. The patient is currently
awaiting genetic testing results. By history, her lifetime risk of
breast cancer, if the results of the genetic testing are negative,
is greater than 20%.

EXAM:
DIGITAL DIAGNOSTIC BILATERAL MAMMOGRAM WITH CAD AND TOMO

[L ML (1 of 2)]
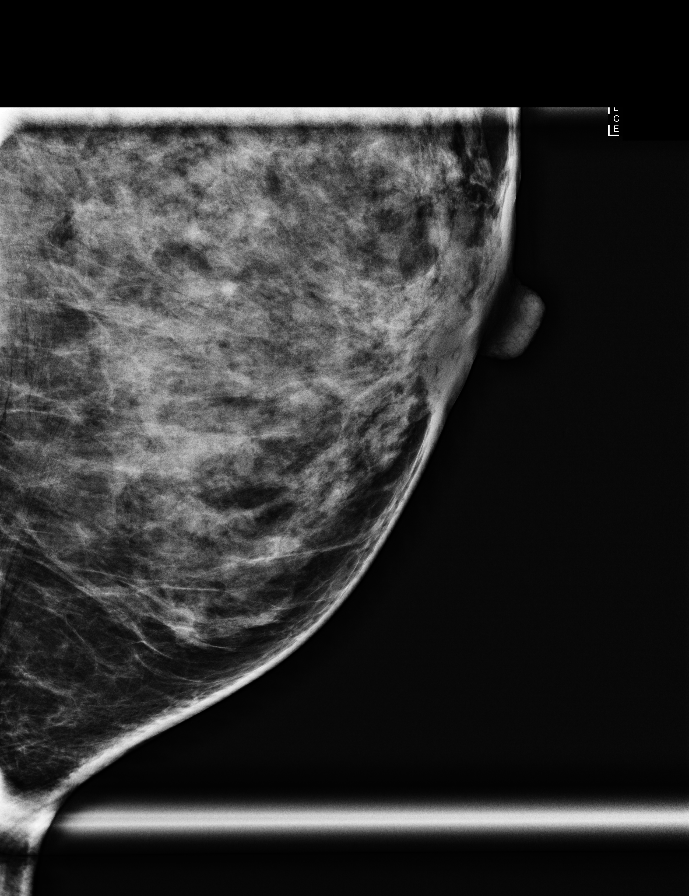

[R ML (1 of 2)]
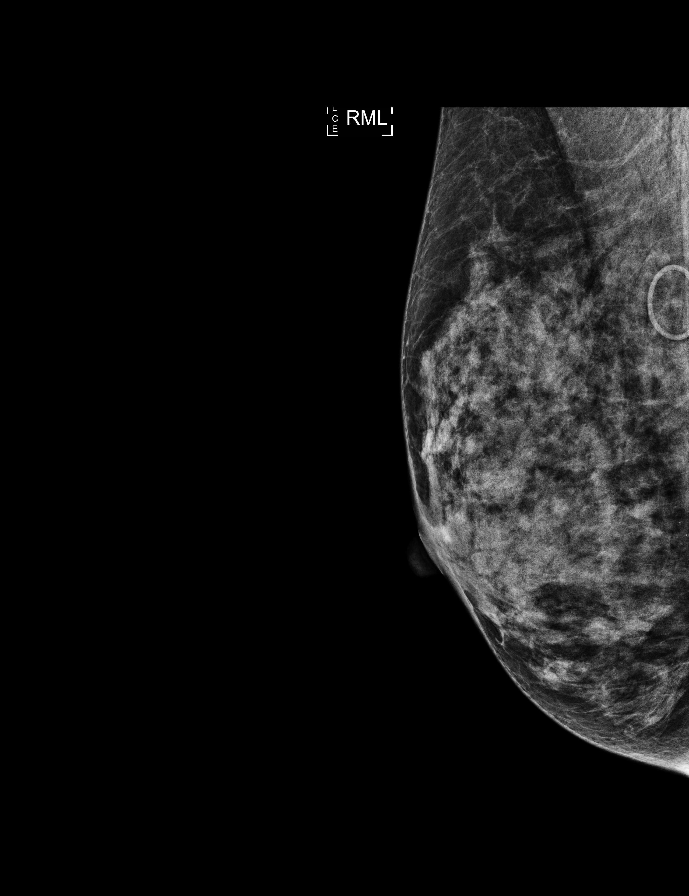

[L CC]
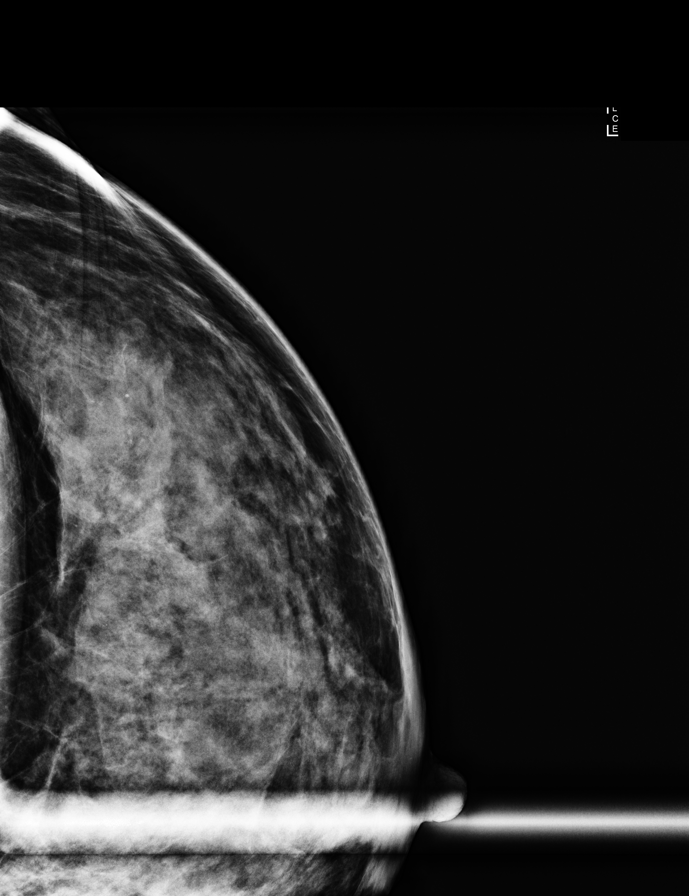

[R ML (2 of 2)]
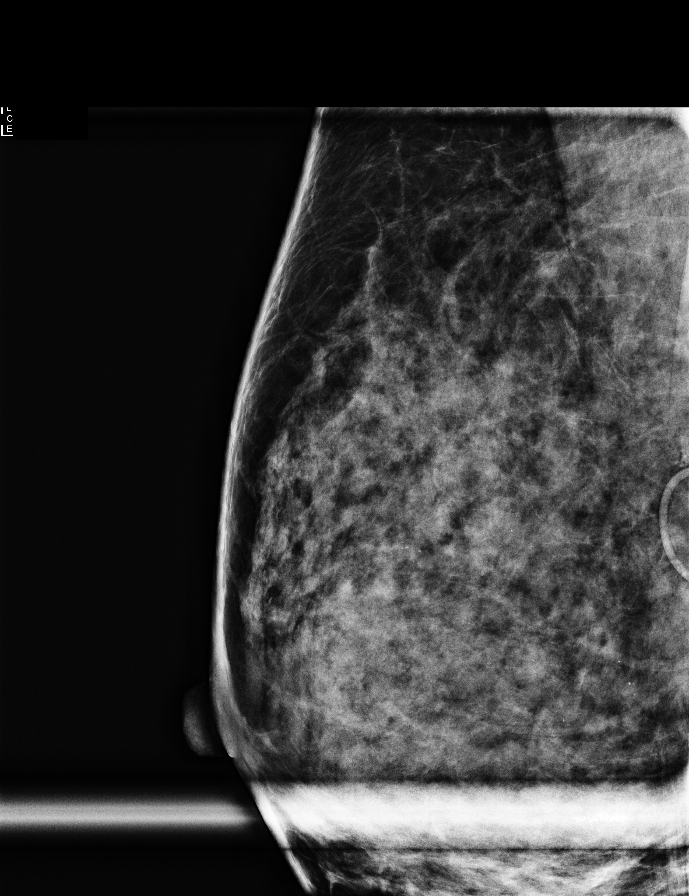

[L ML (2 of 2)]
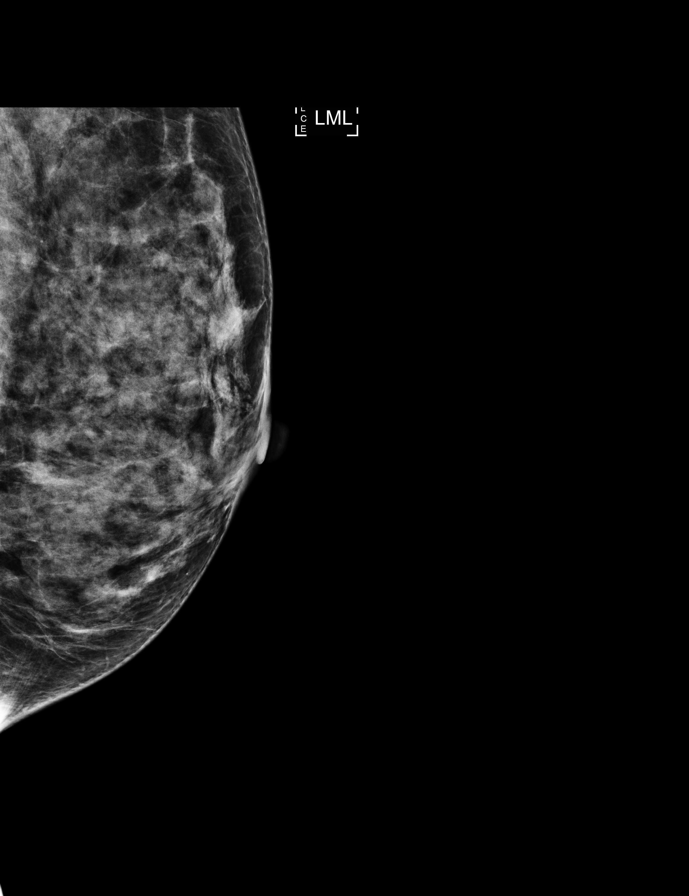

[R CC]
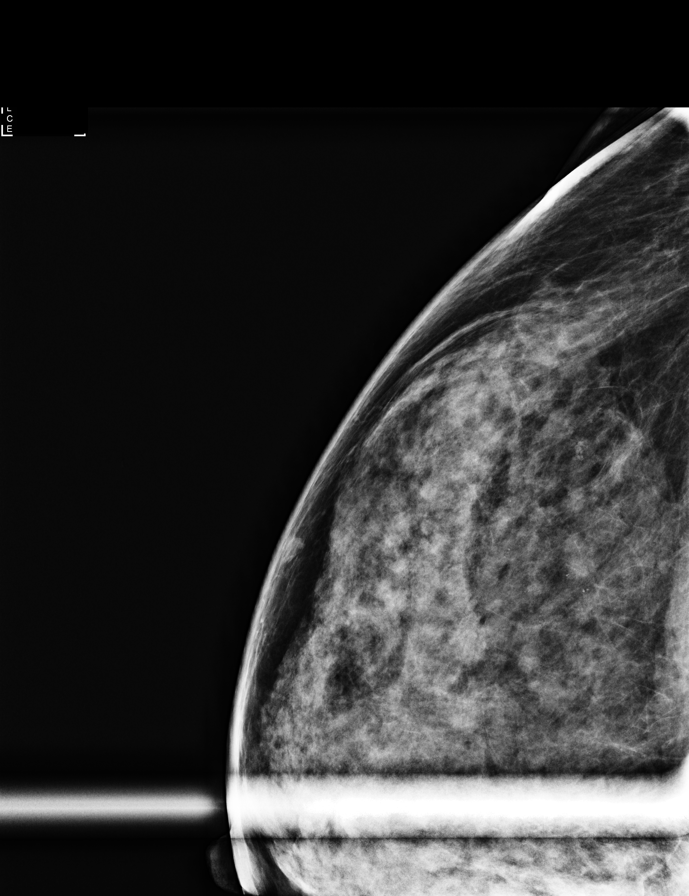

[R MLO synth-2D]
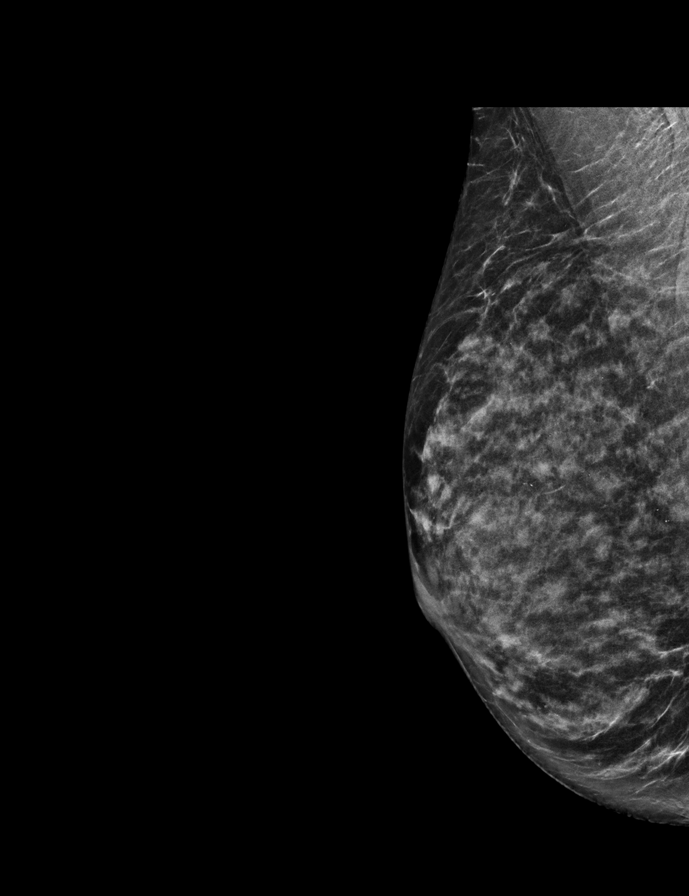

[L MLO synth-2D]
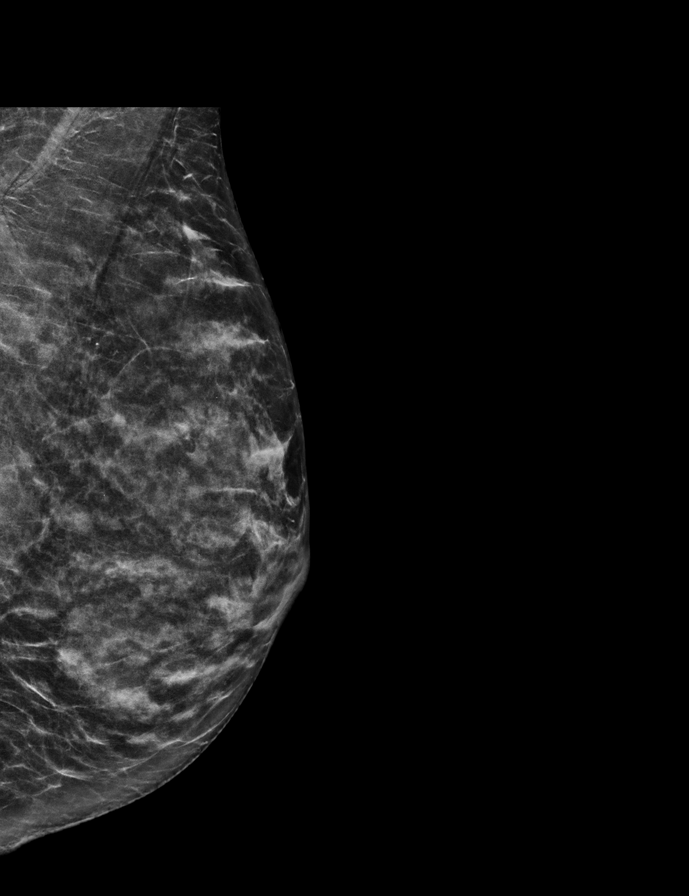

[8 of 36 positions shown; findings below may reference images not displayed]

ACR Breast Density Category c: The breast tissue is heterogeneously
dense, which may obscure small masses.
FINDINGS: Benign breast calcifications are seen bilaterally. A mole is seen in
the superomedial right breast region. No suspicious masses,
calcifications, or distortion are identified bilaterally.

Mammographic images were processed with CAD.
IMPRESSION: No mammographic evidence of malignancy.

RECOMMENDATION:
Treatment of the patient's symptoms should be based on clinical and
physical exam given lack of imaging findings. Recommend continued
annual mammography. Additionally, recommend annual breast MRI. The
patient will be high risk either based on her genetic testing
results or on her calculated lifetime risk of greater than 20%.

I have discussed the findings and recommendations with the patient.
If applicable, a reminder letter will be sent to the patient
regarding the next appointment.

BI-RADS CATEGORY  2: Benign.

## 2021-01-19 ENCOUNTER — Other Ambulatory Visit: Payer: 59

## 2021-01-19 ENCOUNTER — Other Ambulatory Visit: Payer: Self-pay

## 2021-01-19 DIAGNOSIS — D72819 Decreased white blood cell count, unspecified: Secondary | ICD-10-CM

## 2021-01-19 DIAGNOSIS — R7989 Other specified abnormal findings of blood chemistry: Secondary | ICD-10-CM | POA: Diagnosis not present

## 2021-01-20 ENCOUNTER — Other Ambulatory Visit: Payer: Self-pay

## 2021-01-20 LAB — CBC WITH DIFFERENTIAL/PLATELET
Absolute Monocytes: 377 cells/uL (ref 200–950)
Basophils Absolute: 29 cells/uL (ref 0–200)
Basophils Relative: 0.6 %
Eosinophils Absolute: 118 cells/uL (ref 15–500)
Eosinophils Relative: 2.4 %
HCT: 39.1 % (ref 35.0–45.0)
Hemoglobin: 13 g/dL (ref 11.7–15.5)
Lymphs Abs: 1441 cells/uL (ref 850–3900)
MCH: 31.2 pg (ref 27.0–33.0)
MCHC: 33.2 g/dL (ref 32.0–36.0)
MCV: 93.8 fL (ref 80.0–100.0)
MPV: 14.9 fL — ABNORMAL HIGH (ref 7.5–12.5)
Monocytes Relative: 7.7 %
Neutro Abs: 2935 cells/uL (ref 1500–7800)
Neutrophils Relative %: 59.9 %
Platelets: 169 10*3/uL (ref 140–400)
RBC: 4.17 10*6/uL (ref 3.80–5.10)
RDW: 12.4 % (ref 11.0–15.0)
Total Lymphocyte: 29.4 %
WBC: 4.9 10*3/uL (ref 3.8–10.8)

## 2021-01-20 LAB — VITAMIN D 25 HYDROXY (VIT D DEFICIENCY, FRACTURES): Vit D, 25-Hydroxy: 44 ng/mL (ref 30–100)

## 2021-06-02 DIAGNOSIS — Z0000 Encounter for general adult medical examination without abnormal findings: Secondary | ICD-10-CM | POA: Diagnosis not present

## 2021-06-02 DIAGNOSIS — Z23 Encounter for immunization: Secondary | ICD-10-CM | POA: Diagnosis not present

## 2021-10-18 NOTE — Progress Notes (Signed)
56 y.o. G1P1 Married Caucasian female here for annual exam.    Patient complaining of more vaginal discharge than normal. Denies any itching, odor or burning.  Going backpacking in [place] in April.   Husband had prostate cancer last year.   PCP:  Collene Leyden, MD - Mercedes Kirby.   No LMP recorded. Patient has had a hysterectomy.           Sexually active: Yes.   Husband w/prostate cancer The current method of family planning is status post hysterectomy.  Final pathology report shows benign cervix.  Exercising: Yes.     Strength training, hiking, running Smoker:  no   Health Maintenance: Pap:  2012 normal per patient History of abnormal Pap:  Yes, 1998 hx of LEEP?   MMG:  01-15-21 MR Breast bil/Neg/BiRads1 Colonoscopy:   07-15-16 polyps, f/u 8 years per GI BMD: 11-14-16  Result :Normal TDaP:  06/02/2021 Gardasil:   no HIV: donates blood Hep C: donates blood Screening Labs:  PCP Flu vaccine:  completed.  Covid bivalent:  completed.  Shingrix:  completed.  Pneumonia vaccine:  completed.    reports that she has never smoked. She has never used smokeless tobacco. She reports current alcohol use of about 2.0 standard drinks per week. She reports that she does not use drugs.  Past Medical History:  Diagnosis Date   Anemia    with heavy menses   COVID-19 09/2020   Endometriosis    Family history of breast cancer    Family history of polyps in the colon    Family history of skin cancer    Genital warts    History of fainting spells of unknown cause    Hx of adenomatous polyp of colon 07/15/2016   Increased risk of breast cancer 2021   Leukopenia    UTI (urinary tract infection)     Past Surgical History:  Procedure Laterality Date   BILATERAL SALPINGECTOMY  08/29/2011   Performed with hysterectomy   COLONOSCOPY W/ POLYPECTOMY  06/2016   Diminutive adenoma   VAGINAL HYSTERECTOMY  08/29/2011   has her ovaries-done in [place].  Pathology:  Uterus with focal complex hyperplasia without  atypia, fibroids, adenomyosis. Cervix: benign. Tubes:  normal.     Current Outpatient Medications  Medication Sig Dispense Refill   Cholecalciferol (VITAMIN D3) 50 MCG (2000 UT) CAPS Take 1 capsule by mouth daily.     Multiple Vitamin (MULTIVITAMIN) tablet Take 1 tablet by mouth daily.     PFIZER COVID-19 VAC BIVALENT injection      No current facility-administered medications for this visit.    Family History  Problem Relation Age of Onset   Breast cancer Sister 52   Breast cancer Mother 58   Colon polyps Mother    Hypertension Mother    Hyperlipidemia Mother    Heart disease Maternal Grandmother    Heart disease Maternal Grandfather    Heart disease Paternal Grandfather    Cancer Maternal Uncle        mouth cancer d 39   Breast cancer Other        one d. 47, one d. 48s   Skin cancer Maternal Uncle        possibly melanoma   Breast cancer Cousin     Review of Systems  Genitourinary:  Positive for vaginal discharge.  All other systems reviewed and are negative.  Exam:   BP (!) 100/58    Pulse 78    Ht 5' 5.5" (1.664 m)  Wt 135 lb (61.2 kg)    SpO2 98%    BMI 22.12 kg/m     General appearance: alert, cooperative and appears stated age Head: normocephalic, without obvious abnormality, atraumatic Neck: no adenopathy, supple, symmetrical, trachea midline and thyroid normal to inspection and palpation Lungs: clear to auscultation bilaterally Breasts: normal appearance, no masses or tenderness, No nipple retraction or dimpling, No nipple discharge or bleeding, No axillary adenopathy Heart: regular rate and rhythm Abdomen: soft, non-tender; no masses, no organomegaly Extremities: extremities normal, atraumatic, no cyanosis or edema Skin: skin color, texture, turgor normal. No rashes or lesions Lymph nodes: cervical, supraclavicular, and axillary nodes normal. Neurologic: grossly normal  Pelvic: External genitalia:  no lesions              No abnormal inguinal nodes  palpated.              Urethra:  normal appearing urethra with no masses, tenderness or lesions              Bartholins and Skenes: normal                 Vagina: normal appearing vagina with normal color and whitish thin discharge, minor petechiae after contact with speculum.              Cervix:  absent              Pap taken: yes Bimanual Exam:  Uterus:  absent              Adnexa: no mass, fullness, tenderness              Rectal exam: yes.  Confirms.              Anus:  normal sphincter tone, no lesions  Chaperone was present for exam:  Estill Bamberg, CMA  Assessment:   Well woman visit with gynecologic exam. Status post TVH.  Endometriosis. Ovaries remain.  Cervix benign.  Hx cervical dysplasia. Vaginal discharge. Minor atrophy changes. FH breast cancer.   Sister and mother.   Sister tested negative for genetic mutation.  Patient tested negative for genetic mutations.  Increased risk for breast cancer.  20.1%.   Plan: Mammogram screening discussed. She will do breast MRI after her mammogram is back.  Self breast awareness reviewed. Pap and HR HPV collected.  Guidelines for Calcium, Vitamin D, regular exercise program including cardiovascular and weight bearing exercise. Wet prep  negative clue cells, negative yeast, negative trichomonas.  We discussed using neutral soaps for the genital area and avoiding products like dryer sheets.  Hydration can be achieved through water based products, cooking oils, vag vit E suppositories and vaginal estrogens if desired.  BMD around age 78 yo.  Follow up annually and prn.   After visit summary provided.

## 2021-10-19 ENCOUNTER — Encounter: Payer: Self-pay | Admitting: Obstetrics and Gynecology

## 2021-10-19 ENCOUNTER — Other Ambulatory Visit (HOSPITAL_COMMUNITY)
Admission: RE | Admit: 2021-10-19 | Discharge: 2021-10-19 | Disposition: A | Discharge status: Home / Self Care | Payer: 59 | Source: Ambulatory Visit | Attending: Obstetrics and Gynecology | Admitting: Obstetrics and Gynecology

## 2021-10-19 ENCOUNTER — Other Ambulatory Visit: Payer: Self-pay

## 2021-10-19 ENCOUNTER — Ambulatory Visit (INDEPENDENT_AMBULATORY_CARE_PROVIDER_SITE_OTHER): Payer: 59 | Admitting: Obstetrics and Gynecology

## 2021-10-19 VITALS — BP 100/58 | HR 78 | Ht 65.5 in | Wt 135.0 lb

## 2021-10-19 DIAGNOSIS — Z87410 Personal history of cervical dysplasia: Secondary | ICD-10-CM

## 2021-10-19 DIAGNOSIS — Z01419 Encounter for gynecological examination (general) (routine) without abnormal findings: Secondary | ICD-10-CM

## 2021-10-19 DIAGNOSIS — Z9189 Other specified personal risk factors, not elsewhere classified: Secondary | ICD-10-CM | POA: Diagnosis not present

## 2021-10-19 DIAGNOSIS — N898 Other specified noninflammatory disorders of vagina: Secondary | ICD-10-CM

## 2021-10-19 LAB — WET PREP FOR TRICH, YEAST, CLUE

## 2021-10-19 NOTE — Patient Instructions (Signed)

## 2021-10-20 LAB — CYTOLOGY - PAP
Comment: NEGATIVE
Diagnosis: NEGATIVE
High risk HPV: NEGATIVE

## 2021-10-21 ENCOUNTER — Other Ambulatory Visit: Payer: Self-pay | Admitting: Obstetrics and Gynecology

## 2021-10-21 DIAGNOSIS — Z1231 Encounter for screening mammogram for malignant neoplasm of breast: Secondary | ICD-10-CM

## 2021-12-02 ENCOUNTER — Ambulatory Visit: Payer: 59

## 2021-12-06 ENCOUNTER — Ambulatory Visit
Admission: RE | Admit: 2021-12-06 | Discharge: 2021-12-06 | Disposition: A | Discharge status: Home / Self Care | Payer: 59 | Source: Ambulatory Visit | Attending: Obstetrics and Gynecology | Admitting: Obstetrics and Gynecology

## 2021-12-06 DIAGNOSIS — Z1231 Encounter for screening mammogram for malignant neoplasm of breast: Secondary | ICD-10-CM

## 2021-12-07 ENCOUNTER — Encounter: Payer: Self-pay | Admitting: Obstetrics and Gynecology

## 2021-12-07 ENCOUNTER — Other Ambulatory Visit: Payer: Self-pay | Admitting: Obstetrics and Gynecology

## 2021-12-07 DIAGNOSIS — R928 Other abnormal and inconclusive findings on diagnostic imaging of breast: Secondary | ICD-10-CM

## 2021-12-10 ENCOUNTER — Ambulatory Visit: Payer: 59

## 2021-12-10 ENCOUNTER — Ambulatory Visit
Admission: RE | Admit: 2021-12-10 | Discharge: 2021-12-10 | Disposition: A | Discharge status: Home / Self Care | Payer: 59 | Source: Ambulatory Visit | Attending: Obstetrics and Gynecology | Admitting: Obstetrics and Gynecology

## 2021-12-10 DIAGNOSIS — R922 Inconclusive mammogram: Secondary | ICD-10-CM | POA: Diagnosis not present

## 2021-12-10 DIAGNOSIS — R928 Other abnormal and inconclusive findings on diagnostic imaging of breast: Secondary | ICD-10-CM

## 2021-12-13 ENCOUNTER — Telehealth: Payer: Self-pay

## 2021-12-13 DIAGNOSIS — Z9189 Other specified personal risk factors, not elsewhere classified: Secondary | ICD-10-CM

## 2021-12-13 NOTE — Telephone Encounter (Signed)
Mercedes Cobbs, MD  [facility] Triage ?Please schedule breast MRI for patient.  ?She has increased risk of breast cancer ?

## 2021-12-13 NOTE — Telephone Encounter (Signed)
I am ok with her doing the MRI 6 months from her mammogram time as well.  ?

## 2021-12-13 NOTE — Telephone Encounter (Signed)
I called patient to schedule MRI. She said the radiologist explained to her that they like to do the mammo and MRI six months apart.  So that every six months she is being checked. I canceled the order I placed and placed recall letter. Patient will call in September to schedule and I will hold in our inbasket to call her then. ? ? I will route to Dr. Quincy Simmonds to make sure she is agreeable to this plan. ?

## 2022-01-03 ENCOUNTER — Other Ambulatory Visit: Payer: 59

## 2022-04-18 DIAGNOSIS — D2261 Melanocytic nevi of right upper limb, including shoulder: Secondary | ICD-10-CM | POA: Diagnosis not present

## 2022-04-18 DIAGNOSIS — L578 Other skin changes due to chronic exposure to nonionizing radiation: Secondary | ICD-10-CM | POA: Diagnosis not present

## 2022-04-18 DIAGNOSIS — D2272 Melanocytic nevi of left lower limb, including hip: Secondary | ICD-10-CM | POA: Diagnosis not present

## 2022-04-18 DIAGNOSIS — L821 Other seborrheic keratosis: Secondary | ICD-10-CM | POA: Diagnosis not present

## 2022-04-18 DIAGNOSIS — D485 Neoplasm of uncertain behavior of skin: Secondary | ICD-10-CM | POA: Diagnosis not present

## 2022-04-18 DIAGNOSIS — D225 Melanocytic nevi of trunk: Secondary | ICD-10-CM | POA: Diagnosis not present

## 2022-04-18 DIAGNOSIS — L814 Other melanin hyperpigmentation: Secondary | ICD-10-CM | POA: Diagnosis not present

## 2022-04-18 NOTE — Telephone Encounter (Signed)
Patient received our reminder letter to schedule Breast MRI in Sept.  Order placed and patient provided phone number to call to schedule. I will monitor so I can route to ins to check PA.

## 2022-04-18 NOTE — Telephone Encounter (Signed)
Breast MRI scheduled for 06/10/22 at [facility]. Routed to [employer] as well for PA.

## 2022-04-18 NOTE — Telephone Encounter (Signed)
Update received.  Please close this encounter.

## 2022-06-10 ENCOUNTER — Other Ambulatory Visit: Payer: Self-pay | Admitting: Obstetrics and Gynecology

## 2022-06-10 ENCOUNTER — Ambulatory Visit
Admission: RE | Admit: 2022-06-10 | Discharge: 2022-06-10 | Disposition: A | Discharge status: Home / Self Care | Payer: 59 | Source: Ambulatory Visit | Attending: Obstetrics and Gynecology | Admitting: Obstetrics and Gynecology

## 2022-06-10 DIAGNOSIS — N6489 Other specified disorders of breast: Secondary | ICD-10-CM | POA: Diagnosis not present

## 2022-06-10 DIAGNOSIS — Z9189 Other specified personal risk factors, not elsewhere classified: Secondary | ICD-10-CM

## 2022-06-10 DIAGNOSIS — R928 Other abnormal and inconclusive findings on diagnostic imaging of breast: Secondary | ICD-10-CM

## 2022-06-10 MED ORDER — GADOPICLENOL 0.5 MMOL/ML IV SOLN
6.0000 mL | Freq: Once | INTRAVENOUS | Status: AC | PRN
  Administered 2022-06-10: 6 mL via INTRAVENOUS

## 2022-06-13 DIAGNOSIS — D225 Melanocytic nevi of trunk: Secondary | ICD-10-CM | POA: Diagnosis not present

## 2022-06-13 DIAGNOSIS — D239 Other benign neoplasm of skin, unspecified: Secondary | ICD-10-CM | POA: Diagnosis not present

## 2022-06-20 DIAGNOSIS — N63 Unspecified lump in unspecified breast: Secondary | ICD-10-CM | POA: Diagnosis not present

## 2022-06-20 DIAGNOSIS — Z13220 Encounter for screening for lipoid disorders: Secondary | ICD-10-CM | POA: Diagnosis not present

## 2022-06-20 DIAGNOSIS — Z1159 Encounter for screening for other viral diseases: Secondary | ICD-10-CM | POA: Diagnosis not present

## 2022-06-20 DIAGNOSIS — Z0000 Encounter for general adult medical examination without abnormal findings: Secondary | ICD-10-CM | POA: Diagnosis not present

## 2022-06-22 ENCOUNTER — Ambulatory Visit
Admission: RE | Admit: 2022-06-22 | Discharge: 2022-06-22 | Disposition: A | Discharge status: Home / Self Care | Payer: 59 | Source: Ambulatory Visit | Attending: Obstetrics and Gynecology | Admitting: Obstetrics and Gynecology

## 2022-06-22 ENCOUNTER — Other Ambulatory Visit (HOSPITAL_COMMUNITY): Payer: Self-pay | Admitting: Diagnostic Radiology

## 2022-06-22 DIAGNOSIS — R928 Other abnormal and inconclusive findings on diagnostic imaging of breast: Secondary | ICD-10-CM

## 2022-06-22 DIAGNOSIS — N6324 Unspecified lump in the left breast, lower inner quadrant: Secondary | ICD-10-CM | POA: Diagnosis not present

## 2022-06-22 DIAGNOSIS — N6012 Diffuse cystic mastopathy of left breast: Secondary | ICD-10-CM | POA: Diagnosis not present

## 2022-06-22 MED ORDER — GADOPICLENOL 0.5 MMOL/ML IV SOLN
6.0000 mL | Freq: Once | INTRAVENOUS | Status: AC | PRN
  Administered 2022-06-22: 6 mL via INTRAVENOUS

## 2022-06-27 DIAGNOSIS — D239 Other benign neoplasm of skin, unspecified: Secondary | ICD-10-CM | POA: Diagnosis not present

## 2022-06-27 DIAGNOSIS — Z4802 Encounter for removal of sutures: Secondary | ICD-10-CM | POA: Diagnosis not present

## 2022-06-27 DIAGNOSIS — Z86018 Personal history of other benign neoplasm: Secondary | ICD-10-CM | POA: Diagnosis not present

## 2022-10-07 NOTE — Progress Notes (Signed)
57 y.o. G1P1 Married Caucasian female here for annual exam.    Elevated cholesterol but good ratios per patient.   Going backpacking in [place].   PCP:   Collene Leyden, MD  No LMP recorded. Patient has had a hysterectomy.           Sexually active: Yes.    The current method of family planning is status post hysterectomy.    Exercising: Yes.     5x a week, 2 HITT, strength training and hiking. Smoker:  no  Health Maintenance: Pap:  10/19/21 neg: HR HPV neg History of abnormal Pap:  yes, 1998 hx of leep MMG:  12/10/21 Breast Density Category D, BI-RADS CATEGORY 1 Neg Colonoscopy:  07/15/16 - told she is due 8 years follow up. BMD:   11/14/16  Result  normal.  She was not menopausal then.    TDaP:  done with PCP Gardasil:   no HIV: donates blood Hep C: donates blood Screening Labs: PCP   reports that she has never smoked. She has never used smokeless tobacco. She reports current alcohol use of about 2.0 standard drinks of alcohol per week. She reports that she does not use drugs.  Past Medical History:  Diagnosis Date   Anemia    with heavy menses   COVID-19 09/2020   Endometriosis    Family history of breast cancer    Family history of polyps in the colon    Family history of skin cancer    Genital warts    History of fainting spells of unknown cause    Hx of adenomatous polyp of colon 07/15/2016   Increased risk of breast cancer 2021   Leukopenia    UTI (urinary tract infection)     Past Surgical History:  Procedure Laterality Date   BILATERAL SALPINGECTOMY  08/29/2011   Performed with hysterectomy   COLONOSCOPY W/ POLYPECTOMY  06/2016   Diminutive adenoma   VAGINAL HYSTERECTOMY  08/29/2011   has her ovaries-done in [place].  Pathology:  Uterus with focal complex hyperplasia without atypia, fibroids, adenomyosis. Cervix: benign. Tubes:  normal.     Current Outpatient Medications  Medication Sig Dispense Refill   Cholecalciferol (VITAMIN D3) 50 MCG (2000 UT) CAPS  Take 1 capsule by mouth daily.     Multiple Vitamin (MULTIVITAMIN) tablet Take 1 tablet by mouth daily.     PFIZER COVID-19 VAC BIVALENT injection      No current facility-administered medications for this visit.    Family History  Problem Relation Age of Onset   Breast cancer Sister 82   Breast cancer Mother 72   Colon polyps Mother    Hypertension Mother    Hyperlipidemia Mother    Heart disease Maternal Grandmother    Heart disease Maternal Grandfather    Heart disease Paternal Grandfather    Cancer Maternal Uncle        mouth cancer d 67   Breast cancer Other        one d. 58, one d. 16s   Skin cancer Maternal Uncle        possibly melanoma   Breast cancer Cousin     Review of Systems  All other systems reviewed and are negative.   Exam:   BP 118/78 (BP Location: Left Arm, Patient Position: Sitting, Cuff Size: Normal)   Pulse 62   Ht 5' 5.5" (1.664 m)   Wt 134 lb (60.8 kg)   SpO2 99%   BMI 21.96 kg/m  General appearance: alert, cooperative and appears stated age Head: normocephalic, without obvious abnormality, atraumatic Neck: no adenopathy, supple, symmetrical, trachea midline and thyroid normal to inspection and palpation Lungs: clear to auscultation bilaterally Breasts: normal appearance, no masses or tenderness, No nipple retraction or dimpling, No nipple discharge or bleeding, No axillary adenopathy Heart: regular rate and rhythm Abdomen: soft, non-tender; no masses, no organomegaly Extremities: extremities normal, atraumatic, no cyanosis or edema Skin: skin color, texture, turgor normal. No rashes or lesions Lymph nodes: cervical, supraclavicular, and axillary nodes normal. Neurologic: grossly normal  Pelvic: External genitalia:  no lesions              No abnormal inguinal nodes palpated.              Urethra:  normal appearing urethra with no masses, tenderness or lesions              Bartholins and Skenes: normal                 Vagina: normal  appearing vagina with normal color and discharge, no lesions              Cervix: absent              Pap taken: no Bimanual Exam:  Uterus:  absent              Adnexa: no mass, fullness, tenderness              Rectal exam: yes.  Confirms.              Anus:  normal sphincter tone, no lesions  Chaperone was present for exam:  Emily  Assessment:   Well woman visit with gynecologic exam. Status post TVH.  Endometriosis. Ovaries remain.  Cervix benign.  Hx cervical dysplasia.  LEEP in 1998? FH breast cancer.   Sister and mother.   Sister tested negative for genetic mutation.  Patient tested negative for genetic mutations.  Increased risk for breast cancer.  20.1%.   Plan: Mammogram screening discussed. Self breast awareness reviewed. Pap and HR HPV as above. Guidelines for Calcium, Vitamin D, regular exercise program including cardiovascular and weight bearing exercise. Patient accepts referral to Dr. Lindi Adie in the breast cancer prevention clinic at [facility]. Follow up annually and prn.   After visit summary provided.

## 2022-10-20 ENCOUNTER — Encounter: Payer: Self-pay | Admitting: Obstetrics and Gynecology

## 2022-10-20 ENCOUNTER — Telehealth: Payer: Self-pay | Admitting: Obstetrics and Gynecology

## 2022-10-20 ENCOUNTER — Ambulatory Visit (INDEPENDENT_AMBULATORY_CARE_PROVIDER_SITE_OTHER): Payer: 59 | Admitting: Obstetrics and Gynecology

## 2022-10-20 VITALS — BP 118/78 | HR 62 | Ht 65.5 in | Wt 134.0 lb

## 2022-10-20 DIAGNOSIS — Z9189 Other specified personal risk factors, not elsewhere classified: Secondary | ICD-10-CM

## 2022-10-20 DIAGNOSIS — L578 Other skin changes due to chronic exposure to nonionizing radiation: Secondary | ICD-10-CM | POA: Insufficient documentation

## 2022-10-20 DIAGNOSIS — L821 Other seborrheic keratosis: Secondary | ICD-10-CM | POA: Insufficient documentation

## 2022-10-20 DIAGNOSIS — Z86018 Personal history of other benign neoplasm: Secondary | ICD-10-CM | POA: Insufficient documentation

## 2022-10-20 DIAGNOSIS — D2261 Melanocytic nevi of right upper limb, including shoulder: Secondary | ICD-10-CM | POA: Insufficient documentation

## 2022-10-20 DIAGNOSIS — Z01419 Encounter for gynecological examination (general) (routine) without abnormal findings: Secondary | ICD-10-CM

## 2022-10-20 DIAGNOSIS — D2272 Melanocytic nevi of left lower limb, including hip: Secondary | ICD-10-CM | POA: Insufficient documentation

## 2022-10-20 DIAGNOSIS — Z803 Family history of malignant neoplasm of breast: Secondary | ICD-10-CM

## 2022-10-20 NOTE — Telephone Encounter (Signed)
Referral sent 

## 2022-10-20 NOTE — Patient Instructions (Signed)

## 2022-10-20 NOTE — Telephone Encounter (Signed)
Please make a referral to Dr. Lindi Adie at the [facility] in the high risk breast cancer prevention clinic.    My patient is at increased risk of breast cancer at 20.1%.  She has family history of breast cancer.   She has had negative genetic testing.

## 2022-10-28 ENCOUNTER — Other Ambulatory Visit: Payer: Self-pay | Admitting: Obstetrics and Gynecology

## 2022-10-28 DIAGNOSIS — Z1231 Encounter for screening mammogram for malignant neoplasm of breast: Secondary | ICD-10-CM

## 2022-11-01 NOTE — Telephone Encounter (Signed)
Appointment desk note that the referral is closed.  Patient declined appointment.  Encounter closed.

## 2022-12-16 ENCOUNTER — Ambulatory Visit
Admission: RE | Admit: 2022-12-16 | Discharge: 2022-12-16 | Disposition: A | Discharge status: Home / Self Care | Payer: 59 | Source: Ambulatory Visit | Attending: Obstetrics and Gynecology | Admitting: Obstetrics and Gynecology

## 2022-12-16 DIAGNOSIS — Z1231 Encounter for screening mammogram for malignant neoplasm of breast: Secondary | ICD-10-CM | POA: Diagnosis not present

## 2023-03-03 DIAGNOSIS — L820 Inflamed seborrheic keratosis: Secondary | ICD-10-CM | POA: Diagnosis not present

## 2023-04-25 DIAGNOSIS — L821 Other seborrheic keratosis: Secondary | ICD-10-CM | POA: Diagnosis not present

## 2023-04-25 DIAGNOSIS — D2272 Melanocytic nevi of left lower limb, including hip: Secondary | ICD-10-CM | POA: Diagnosis not present

## 2023-04-25 DIAGNOSIS — D2261 Melanocytic nevi of right upper limb, including shoulder: Secondary | ICD-10-CM | POA: Diagnosis not present

## 2023-04-25 DIAGNOSIS — D485 Neoplasm of uncertain behavior of skin: Secondary | ICD-10-CM | POA: Diagnosis not present

## 2023-04-25 DIAGNOSIS — D225 Melanocytic nevi of trunk: Secondary | ICD-10-CM | POA: Diagnosis not present

## 2023-04-25 DIAGNOSIS — L570 Actinic keratosis: Secondary | ICD-10-CM | POA: Diagnosis not present

## 2023-04-25 DIAGNOSIS — D239 Other benign neoplasm of skin, unspecified: Secondary | ICD-10-CM | POA: Diagnosis not present

## 2023-04-25 DIAGNOSIS — L578 Other skin changes due to chronic exposure to nonionizing radiation: Secondary | ICD-10-CM | POA: Diagnosis not present

## 2023-04-25 DIAGNOSIS — L814 Other melanin hyperpigmentation: Secondary | ICD-10-CM | POA: Diagnosis not present

## 2023-06-13 ENCOUNTER — Encounter: Payer: Self-pay | Admitting: Obstetrics and Gynecology

## 2023-06-13 DIAGNOSIS — Z803 Family history of malignant neoplasm of breast: Secondary | ICD-10-CM

## 2023-06-13 DIAGNOSIS — Z9189 Other specified personal risk factors, not elsewhere classified: Secondary | ICD-10-CM

## 2023-06-15 NOTE — Telephone Encounter (Signed)
Order pended for MRI breast bilateral w/wo contrast  LTR 20.1%  AEX 10/20/22 -BS MMG 12/16/22 MRI 06/10/22  Routing to Dr. Edward Jolly to review.

## 2023-07-11 ENCOUNTER — Encounter: Payer: Self-pay | Admitting: Internal Medicine

## 2023-08-04 ENCOUNTER — Ambulatory Visit
Admission: RE | Admit: 2023-08-04 | Discharge: 2023-08-04 | Disposition: A | Discharge status: Home / Self Care | Payer: 59 | Source: Ambulatory Visit | Attending: Obstetrics and Gynecology | Admitting: Obstetrics and Gynecology

## 2023-08-04 DIAGNOSIS — Z1239 Encounter for other screening for malignant neoplasm of breast: Secondary | ICD-10-CM | POA: Diagnosis not present

## 2023-08-04 DIAGNOSIS — Z803 Family history of malignant neoplasm of breast: Secondary | ICD-10-CM

## 2023-08-04 DIAGNOSIS — Z9189 Other specified personal risk factors, not elsewhere classified: Secondary | ICD-10-CM

## 2023-08-04 MED ORDER — GADOPICLENOL 0.5 MMOL/ML IV SOLN
6.0000 mL | Freq: Once | INTRAVENOUS | Status: AC | PRN
Start: 2023-08-04 — End: 2023-08-04
  Administered 2023-08-04: 6 mL via INTRAVENOUS

## 2023-08-09 ENCOUNTER — Telehealth: Payer: Self-pay | Admitting: *Deleted

## 2023-08-09 NOTE — Telephone Encounter (Signed)
Patient left message stating she is scheduled for AEX 12/04/23 with Dr. Edward Jolly, requesting order for MMG for 09/2023.   Per review of EPIC, screening MMG 12/16/22 and Breast MRI 08/04/23. BiRads 1 Neg.    Call returned to patient. Left detailed message, ok per dpr. Advised of imaging as seen above, should not need order for screening MMG at [facility], ok to call to schedule. Advised if any additional questions or assistance needed, return call to the office at [phone], option 4.   Routing to provider for final review. Patient is agreeable to disposition. Will close encounter.

## 2023-08-10 ENCOUNTER — Ambulatory Visit (INDEPENDENT_AMBULATORY_CARE_PROVIDER_SITE_OTHER): Payer: 59 | Admitting: Family Medicine

## 2023-08-10 ENCOUNTER — Encounter: Payer: Self-pay | Admitting: Family Medicine

## 2023-08-10 VITALS — BP 126/86 | Ht 66.0 in | Wt 135.0 lb

## 2023-08-10 DIAGNOSIS — M7702 Medial epicondylitis, left elbow: Secondary | ICD-10-CM

## 2023-08-10 NOTE — Patient Instructions (Signed)
You have medial epicondylitis (golfer's elbow). Avoid painful activities as much as possible (unless doing home exercises). Ibuprofen or tylenol as needed for pain. Icing 3-4 times a day - careful around ulnar nerve on inside of elbow. Do home exercises daily as directed. Counterforce brace to unload area of pain while it heals. Wrist brace at bedtime (consider during the day too). Consider formal physical therapy, nitro patches if not improving. Follow up with me in 5-6 weeks.

## 2023-08-10 NOTE — Progress Notes (Signed)
PCP: Irven Coe, MD  Subjective:   HPI: Patient is a 57 y.o. female here for left elbow pain.  Patient reports about 1.5 months ago she recalls doing crab walks and HIIT workouts. No acute injury but started to get left medial elbow pain after this. Has tried icing, ibuprofen. Notices more with weighted ball workouts. No swelling or acute injury She is right handed.  Past Medical History:  Diagnosis Date   Anemia    with heavy menses   COVID-19 09/2020   Endometriosis    Family history of breast cancer    Family history of polyps in the colon    Family history of skin cancer    Genital warts    History of fainting spells of unknown cause    Hx of adenomatous polyp of colon 07/15/2016   Increased risk of breast cancer 2021   Leukopenia    UTI (urinary tract infection)     Current Outpatient Medications on File Prior to Visit  Medication Sig Dispense Refill   Cholecalciferol (VITAMIN D3) 50 MCG (2000 UT) CAPS Take 1 capsule by mouth daily.     Multiple Vitamin (MULTIVITAMIN) tablet Take 1 tablet by mouth daily.     PFIZER COVID-19 VAC BIVALENT injection      No current facility-administered medications on file prior to visit.    Past Surgical History:  Procedure Laterality Date   BILATERAL SALPINGECTOMY  08/29/2011   Performed with hysterectomy   COLONOSCOPY W/ POLYPECTOMY  06/2016   Diminutive adenoma   VAGINAL HYSTERECTOMY  08/29/2011   has her ovaries-done in [place].  Pathology:  Uterus with focal complex hyperplasia without atypia, fibroids, adenomyosis. Cervix: benign. Tubes:  normal.     Allergies  Allergen Reactions   Codeine Other (See Comments)    Pass out--breaks out in a sweat    BP 126/86   Ht 5\' 6"  (1.676 m)   Wt 135 lb (61.2 kg)   BMI 21.79 kg/m       No data to display              No data to display              Objective:  Physical Exam:  Gen: NAD, comfortable in exam room  Left elbow: No deformity, swelling,  bruising. FROM with 5/5 strength.  Pain with resisted pronation, wrist flexion Tenderness to palpation medial epicondyle.  No other tenderness. NVI distally. Collateral ligaments intact.  Limited MSK u/s left elbow:  Common flexor tendon and UCL appear intact.  Mild neovascularity insertion of common flexor tendon.   Assessment & Plan:  1. Left medial epicondylitis - home exercise program reviewed.  Counterforce brace, wrist brace.  Ibuprofen or tylenol as needed.  Icing if needed.  F/u in 5-6 weeks.

## 2023-08-22 ENCOUNTER — Ambulatory Visit (AMBULATORY_SURGERY_CENTER): Payer: 59

## 2023-08-22 VITALS — Ht 65.5 in | Wt 136.0 lb

## 2023-08-22 DIAGNOSIS — Z860100 Personal history of colon polyps, unspecified: Secondary | ICD-10-CM

## 2023-08-22 MED ORDER — NA SULFATE-K SULFATE-MG SULF 17.5-3.13-1.6 GM/177ML PO SOLN: 1.0000 | Freq: Once | ORAL | 0 refills | Status: AC

## 2023-08-22 NOTE — Progress Notes (Signed)

## 2023-09-11 DIAGNOSIS — Z130 Encounter for screening for diseases of the blood and blood-forming organs and certain disorders involving the immune mechanism: Secondary | ICD-10-CM | POA: Diagnosis not present

## 2023-09-11 DIAGNOSIS — E782 Mixed hyperlipidemia: Secondary | ICD-10-CM | POA: Diagnosis not present

## 2023-09-14 ENCOUNTER — Ambulatory Visit: Payer: 59 | Admitting: Family Medicine

## 2023-09-14 DIAGNOSIS — Z0000 Encounter for general adult medical examination without abnormal findings: Secondary | ICD-10-CM | POA: Diagnosis not present

## 2023-09-21 NOTE — Progress Notes (Signed)
[facility] History and Physical   Primary Care Physician:  Irven Coe, MD   Reason for Procedure:  History of adenomatous colon polyp  Plan:    Colonoscopy     HPI: Mercedes Kirby is a 58 y.o. female status post removal of a diminutive adenoma in 2017, presenting for a surveillance colonoscopy.   Past Medical History:  Diagnosis Date   Anemia    with heavy menses   COVID-19 09/2020   Endometriosis    Family history of breast cancer    Family history of polyps in the colon    Family history of skin cancer    Genital warts    History of fainting spells of unknown cause    Hx of adenomatous polyp of colon 07/15/2016   Increased risk of breast cancer 2021   Leukopenia    UTI (urinary tract infection)     Past Surgical History:  Procedure Laterality Date   BILATERAL SALPINGECTOMY  08/29/2011   Performed with hysterectomy   COLONOSCOPY W/ POLYPECTOMY  06/2016   Diminutive adenoma   VAGINAL HYSTERECTOMY  08/29/2011   has her ovaries-done in [place].  Pathology:  Uterus with focal complex hyperplasia without atypia, fibroids, adenomyosis. Cervix: benign. Tubes:  normal.     Prior to Admission medications   Medication Sig Start Date End Date Taking? Authorizing Provider  Cholecalciferol (VITAMIN D3) 50 MCG (2000 UT) CAPS Take 1 capsule by mouth daily. Patient not taking: Reported on 08/22/2023    [provider]  Multiple Vitamin (MULTIVITAMIN) tablet Take 1 tablet by mouth daily. Patient not taking: Reported on 08/22/2023    [provider]  PFIZER COVID-19 VAC BIVALENT injection  09/13/21   [provider]    No current outpatient medications on file.   Current Facility-Administered Medications  Medication Dose Route Frequency Provider Last Rate Last Admin   0.9 %  sodium chloride infusion  500 mL Intravenous Once Iva Boop, MD        Allergies as of 09/22/2023 - Review Complete 09/22/2023  Allergen Reaction Noted    Codeine Other (See Comments) 01/17/2018    Family History  Problem Relation Age of Onset   Breast cancer Sister 67   Breast cancer Mother 73   Colon polyps Mother    Hypertension Mother    Hyperlipidemia Mother    Heart disease Maternal Grandmother    Heart disease Maternal Grandfather    Heart disease Paternal Grandfather    Cancer Maternal Uncle        mouth cancer d 56   Breast cancer Other        one d. 97, one d. 66s   Skin cancer Maternal Uncle        possibly melanoma   Breast cancer Cousin     Social History   Socioeconomic History   Marital status: Married    Spouse name: Not on file   Number of children: Not on file   Years of education: Not on file   Highest education level: Not on file  Occupational History   Not on file  Tobacco Use   Smoking status: Never   Smokeless tobacco: Never  Vaping Use   Vaping status: Never Used  Substance and Sexual Activity   Alcohol use: Yes    Alcohol/week: 2.0 standard drinks of alcohol    Types: 2 Glasses of wine per week   Drug use: Never   Sexual activity: Yes    Birth control/protection: Surgical  Comment: TVH--ovaries remain  Other Topics Concern   Not on file  Social History Narrative   Married 1 daughter   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[employer]   Has lived in [place] and now [place], husband works for a Physiological scientist as a Solicitor (Education administrator)   Never smoker 2-3 alcoholic drinks a week 1 caffeinated beverage daily no tobacco no drug use   Social Drivers of Community education officer: Not on [employer] Insecurity: Not on file  Transportation Needs: Not on file  Physical Activity: Not on file  Stress: Not on file  Social Connections: Not on file  Intimate Partner Violence: Not on file    Review of Systems:  All other review of systems negative except as mentioned in the HPI.  Physical Exam: Vital signs BP 117/81   Pulse 64   Temp 98.6  F (37 C) (Temporal)   Ht 5' 5.5" (1.664 m)   Wt 136 lb (61.7 kg)   SpO2 99%   BMI 22.29 kg/m   General:   Alert,  Well-developed, well-nourished, pleasant and cooperative in NAD Lungs:  Clear throughout to auscultation.   Heart:  Regular rate and rhythm; no murmurs, clicks, rubs,  or gallops. Abdomen:  Soft, nontender and nondistended. Normal bowel sounds.   Neuro/Psych:  Alert and cooperative. Normal mood and affect. A and O x 3   @Aarion Kittrell  Sena Slate, MD, [facility] [phone] (pager) 09/22/2023 9:48 AM@

## 2023-09-22 ENCOUNTER — Encounter: Payer: Self-pay | Admitting: Internal Medicine

## 2023-09-22 ENCOUNTER — Ambulatory Visit (AMBULATORY_SURGERY_CENTER): Payer: BC Managed Care – PPO | Admitting: Internal Medicine

## 2023-09-22 VITALS — BP 99/69 | HR 67 | Temp 98.6°F | Resp 17 | Ht 65.5 in | Wt 136.0 lb

## 2023-09-22 DIAGNOSIS — Z1211 Encounter for screening for malignant neoplasm of colon: Secondary | ICD-10-CM

## 2023-09-22 DIAGNOSIS — Z860100 Personal history of colon polyps, unspecified: Secondary | ICD-10-CM

## 2023-09-22 DIAGNOSIS — Z860101 Personal history of adenomatous and serrated colon polyps: Secondary | ICD-10-CM | POA: Diagnosis not present

## 2023-09-22 DIAGNOSIS — K5730 Diverticulosis of large intestine without perforation or abscess without bleeding: Secondary | ICD-10-CM | POA: Diagnosis not present

## 2023-09-22 MED ORDER — SODIUM CHLORIDE 0.9 % IV SOLN: 500.0000 mL | Freq: Once | INTRAVENOUS | Status: AC

## 2023-09-22 NOTE — Op Note (Signed)
[facility] Patient Name: Mercedes Kirby Procedure Date: 09/22/2023 9:54 AM MRN: [id number] Endoscopist: Iva Boop , MD, [id number] Age: 58 Referring MD:  Date of Birth: 1966-01-04 Gender: Female Account #: [id number] Procedure:                Colonoscopy Indications:              Surveillance: Personal history of adenomatous                            polyps on last colonoscopy > 5 years ago, Last                            colonoscopy: 2017 Medicines:                Monitored Anesthesia Care Procedure:                Pre-Anesthesia Assessment:                           - Prior to the procedure, a History and Physical                            was performed, and patient medications and                            allergies were reviewed. The patient's tolerance of                            previous anesthesia was also reviewed. The risks                            and benefits of the procedure and the sedation                            options and risks were discussed with the patient.                            All questions were answered, and informed consent                            was obtained. Prior Anticoagulants: The patient has                            taken no anticoagulant or antiplatelet agents. ASA                            Grade Assessment: I - A normal, healthy patient.                            After reviewing the risks and benefits, the patient                            was deemed in satisfactory condition to undergo the  procedure.                           After obtaining informed consent, the colonoscope                            was passed under direct vision. Throughout the                            procedure, the patient's blood pressure, pulse, and                            oxygen saturations were monitored continuously. The                            Olympus Scope [id number] was introduced through the                             anus and advanced to the the cecum, identified by                            appendiceal orifice and ileocecal valve. The                            colonoscopy was performed without difficulty. The                            patient tolerated the procedure well. The quality                            of the bowel preparation was excellent. The bowel                            preparation used was SUPREP via split dose                            instruction. The ileocecal valve, appendiceal                            orifice, and rectum were photographed. Scope In: 10:00:46 AM Scope Out: 10:11:26 AM Scope Withdrawal Time: 0 hours 7 minutes 41 seconds  Total Procedure Duration: 0 hours 10 minutes 40 seconds  Findings:                 The perianal and digital rectal examinations were                            normal.                           Multiple diverticula were found in the sigmoid                            colon.  The exam was otherwise without abnormality on                            direct and retroflexion views. Complications:            No immediate complications. Estimated Blood Loss:     Estimated blood loss: none. Impression:               - Diverticulosis in the sigmoid colon.                           - The examination was otherwise normal on direct                            and retroflexion views.                           - No specimens collected.                           - Personal history of colonic polyp diminutive                            adenoma 2017. Recommendation:           - Patient has a contact number available for                            emergencies. The signs and symptoms of potential                            delayed complications were discussed with the                            patient. Return to normal activities tomorrow.                            Written discharge instructions were provided to  the                            patient.                           - Resume previous diet.                           - Continue present medications.                           - Repeat colonoscopy in 10 years. Iva Boop, MD 09/22/2023 10:18:35 AM This report has been signed electronically.

## 2023-09-22 NOTE — Progress Notes (Signed)
Drowsy, VSS, resps reg and even. Report to RN

## 2023-09-22 NOTE — Progress Notes (Signed)
Pt's states no medical or surgical changes since previsit or office visit.

## 2023-09-22 NOTE — Patient Instructions (Addendum)
No polyps and no cancer seen.  You do have diverticulosis - thickened muscle rings and pouches in the colon wall. Please read the handout about this condition.  Next routine colonoscopy or other screening test in 10 years - 2035.  I appreciate the opportunity to care for you. Iva Boop, MD, FACG   YOU HAD AN ENDOSCOPIC PROCEDURE TODAY AT THE [facility]:   Refer to the procedure report that was given to you for any specific questions about what was found during the examination.  If the procedure report does not answer your questions, please call your gastroenterologist to clarify.  If you requested that your care partner not be given the details of your procedure findings, then the procedure report has been included in a sealed envelope for you to review at your convenience later.  YOU SHOULD EXPECT: Some feelings of bloating in the abdomen. Passage of more gas than usual.  Walking can help get rid of the air that was put into your GI tract during the procedure and reduce the bloating. If you had a lower endoscopy (such as a colonoscopy or flexible sigmoidoscopy) you may notice spotting of blood in your stool or on the toilet paper. If you underwent a bowel prep for your procedure, you may not have a normal bowel movement for a few days.  Please Note:  You might notice some irritation and congestion in your nose or some drainage.  This is from the oxygen used during your procedure.  There is no need for concern and it should clear up in a day or so.  SYMPTOMS TO REPORT IMMEDIATELY:  Following lower endoscopy (colonoscopy or flexible sigmoidoscopy):  Excessive amounts of blood in the stool  Significant tenderness or worsening of abdominal pains  Swelling of the abdomen that is new, acute  Fever of 100F or higher   For urgent or emergent issues, a gastroenterologist can be reached at any hour by calling [phone]. Do not use MyChart messaging for urgent concerns.     DIET:  We do recommend a small meal at first, but then you may proceed to your regular diet.  Drink plenty of fluids but you should avoid alcoholic beverages for 24 hours.  MEDICATIONS: Continue present medications.  Please see handouts given to you by your recovery nurse: Polyps.  FOLLOW UP: Repeat colonoscopy in 10 years.  Thank you for allowing [place] to provide for your healthcare needs today.  ACTIVITY:  You should plan to take it easy for the rest of today and you should NOT DRIVE or use heavy machinery until tomorrow (because of the sedation medicines used during the test).    FOLLOW UP: Our staff will call the number listed on your records the next business day following your procedure.  We will call around 7:15- 8:00 am to check on you and address any questions or concerns that you may have regarding the information given to you following your procedure. If we do not reach you, we will leave a message.     If any biopsies were taken you will be contacted by phone or by letter within the next 1-3 weeks.  Please call us at [phone] if you have not heard about the biopsies in 3 weeks.    SIGNATURES/CONFIDENTIALITY: You and/or your care partner have signed paperwork which will be entered into your electronic medical record.  These signatures attest to the fact that that the information above on your After Visit Summary has  been reviewed and is understood.  Full responsibility of the confidentiality of this discharge information lies with you and/or your care-partner.

## 2023-09-25 ENCOUNTER — Telehealth: Payer: Self-pay | Admitting: *Deleted

## 2023-09-25 NOTE — Telephone Encounter (Signed)
  Follow up Call-     09/22/2023    9:10 AM  Call back number  Post procedure Call Back phone  # [phone]  Permission to leave phone message Yes     Patient questions:  Do you have a fever, pain , or abdominal swelling? No. Pain Score  0 *  Have you tolerated food without any problems? Yes.    Have you been able to return to your normal activities? Yes.    Do you have any questions about your discharge instructions: Diet   No. Medications  No. Follow up visit  No.  Do you have questions or concerns about your Care? No.  Actions: * If pain score is 4 or above: No action needed, pain <4.

## 2023-10-11 ENCOUNTER — Other Ambulatory Visit: Payer: Self-pay | Admitting: Obstetrics and Gynecology

## 2023-10-11 DIAGNOSIS — Z1231 Encounter for screening mammogram for malignant neoplasm of breast: Secondary | ICD-10-CM

## 2023-10-27 DIAGNOSIS — D485 Neoplasm of uncertain behavior of skin: Secondary | ICD-10-CM | POA: Diagnosis not present

## 2023-10-27 DIAGNOSIS — D2271 Melanocytic nevi of right lower limb, including hip: Secondary | ICD-10-CM | POA: Diagnosis not present

## 2023-10-27 DIAGNOSIS — L820 Inflamed seborrheic keratosis: Secondary | ICD-10-CM | POA: Diagnosis not present

## 2023-11-20 NOTE — Progress Notes (Signed)
 58 y.o. G1P1 Married Caucasian female here for annual exam.    Wants a check of her vitamin D.   Hiked the [facility] - 40 miles.   PCP: Irven Coe, MD   No LMP recorded. Patient has had a hysterectomy.           Sexually active: Yes.    The current method of family planning is status post hysterectomy.    Menopausal hormone therapy:  n/a Exercising: Yes.     Hiking 3x a week, strength training, HIIT Smoker:  no  OB History  Gravida Para Term Preterm AB Living  1 1    1   SAB IAB Ectopic Multiple Live Births          # Outcome Date GA Lbr Len/2nd Weight Sex Type Anes PTL Lv  1 Para              HEALTH MAINTENANCE: Last 2 paps:  10/19/21 neg: HR HPV neg History of abnormal Pap or positive HPV:  yes, hx of LEEP 1998 Mammogram:   12/16/22 Breast Density Cat C, BI-RADS CAT 1 neg Breast MRI:  08/04/23 BI-RADS1  Colonoscopy:  09/22/23 - due in 2035.  Bone Density:  11/14/16  Result  normal  TDap in 2024 with PCP.    Immunization History  Administered Date(s) Administered   Influenza-Unspecified 05/20/2019   PFIZER(Purple Top)SARS-COV-2 Vaccination 08/20/2019, 09/10/2019   Pneumococcal-Unspecified 07/15/2019   Tdap 06/30/2011   Zoster Recombinant(Shingrix) 07/15/2019      reports that she has never smoked. She has never used smokeless tobacco. She reports current alcohol use of about 2.0 standard drinks of alcohol per week. She reports that she does not use drugs.  Past Medical History:  Diagnosis Date   Anemia    with heavy menses   COVID-19 09/2020   Endometriosis    Family history of breast cancer    Family history of polyps in the colon    Family history of skin cancer    Genital warts    History of fainting spells of unknown cause    Hx of adenomatous polyp of colon 07/15/2016   Increased risk of breast cancer 2021   Leukopenia    UTI (urinary tract infection)     Past Surgical History:  Procedure Laterality Date   BILATERAL SALPINGECTOMY  08/29/2011    Performed with hysterectomy   COLONOSCOPY W/ POLYPECTOMY  06/2016   Diminutive adenoma   VAGINAL HYSTERECTOMY  08/29/2011   has her ovaries-done in [place].  Pathology:  Uterus with focal complex hyperplasia without atypia, fibroids, adenomyosis. Cervix: benign. Tubes:  normal.     No current outpatient medications on file.   Current Facility-Administered Medications  Medication Dose Route Frequency Provider Last Rate Last Admin   0.9 %  sodium chloride infusion  500 mL Intravenous Once Iva Boop, MD        ALLERGIES: Codeine  Family History  Problem Relation Age of Onset   Breast cancer Sister 10   Breast cancer Mother 37   Colon polyps Mother    Hypertension Mother    Hyperlipidemia Mother    Heart disease Maternal Grandmother    Heart disease Maternal Grandfather    Heart disease Paternal Grandfather    Cancer Maternal Uncle        mouth cancer d 30   Breast cancer Other        one d. 70, one d. 22s   Skin cancer Maternal Uncle  possibly melanoma   Breast cancer Cousin     Review of Systems  All other systems reviewed and are negative.   PHYSICAL EXAM:  BP 128/80 (BP Location: Right Arm, Patient Position: Sitting, Cuff Size: Small)   Pulse 74   Ht 5' 6.25" (1.683 m)   Wt 134 lb (60.8 kg)   SpO2 98%   BMI 21.47 kg/m     General appearance: alert, cooperative and appears stated age Head: normocephalic, without obvious abnormality, atraumatic Neck: no adenopathy, supple, symmetrical, trachea midline and thyroid normal to inspection and palpation Lungs: clear to auscultation bilaterally Breasts: normal appearance, no masses or tenderness, No nipple retraction or dimpling, No nipple discharge or bleeding, No axillary adenopathy Heart: regular rate and rhythm Abdomen: soft, non-tender; no masses, no organomegaly Extremities: extremities normal, atraumatic, no cyanosis or edema Skin: skin color, texture, turgor normal. No rashes or lesions Lymph nodes:  cervical, supraclavicular, and axillary nodes normal. Neurologic: grossly normal  Pelvic: External genitalia:  no lesions              No abnormal inguinal nodes palpated.              Urethra:  normal appearing urethra with no masses, tenderness or lesions              Bartholins and Skenes: normal                 Vagina: normal appearing vagina with normal color and discharge, no lesions              Cervix: absent              Pap taken: no Bimanual Exam:  Uterus:  absent              Adnexa: no mass, fullness, tenderness              Rectal exam: yes.  Confirms.              Anus:  normal sphincter tone, no lesions  ASSESSMENT: Well woman visit with gynecologic exam. Status post TVH.  Endometriosis. Ovaries remain.  Cervix benign.  Hx cervical dysplasia.  LEEP in 1998? FH breast cancer.   Sister and mother.   Sister tested negative for genetic mutation.  Patient tested negative for genetic mutations.  Increased risk for breast cancer.  20.1%.  PHQ-9: 0  PLAN: Mammogram screening discussed.   Patient will alternate with mammogram and breast MRI every 6 months.   Next breast MRI due in December, 2025.  Self breast awareness reviewed. Pap and HRV collected:  no.  Not indicated. Guidelines for Calcium, Vitamin D, regular exercise program including cardiovascular and weight bearing exercise. Medication refills:  NA Declines referral to medical oncologist.  Check vit D.  Follow up:  yearly and prn.

## 2023-12-04 ENCOUNTER — Encounter: Payer: Self-pay | Admitting: Obstetrics and Gynecology

## 2023-12-04 ENCOUNTER — Ambulatory Visit (INDEPENDENT_AMBULATORY_CARE_PROVIDER_SITE_OTHER): Payer: 59 | Admitting: Obstetrics and Gynecology

## 2023-12-04 VITALS — BP 128/80 | HR 74 | Ht 66.25 in | Wt 134.0 lb

## 2023-12-04 DIAGNOSIS — Z1331 Encounter for screening for depression: Secondary | ICD-10-CM | POA: Diagnosis not present

## 2023-12-04 DIAGNOSIS — Z01419 Encounter for gynecological examination (general) (routine) without abnormal findings: Secondary | ICD-10-CM | POA: Diagnosis not present

## 2023-12-04 DIAGNOSIS — E559 Vitamin D deficiency, unspecified: Secondary | ICD-10-CM | POA: Diagnosis not present

## 2023-12-04 DIAGNOSIS — E785 Hyperlipidemia, unspecified: Secondary | ICD-10-CM | POA: Diagnosis not present

## 2023-12-04 DIAGNOSIS — Z0000 Encounter for general adult medical examination without abnormal findings: Secondary | ICD-10-CM | POA: Diagnosis not present

## 2023-12-04 NOTE — Patient Instructions (Signed)

## 2023-12-05 ENCOUNTER — Encounter: Payer: Self-pay | Admitting: Obstetrics and Gynecology

## 2023-12-05 LAB — VITAMIN D 25 HYDROXY (VIT D DEFICIENCY, FRACTURES): Vit D, 25-Hydroxy: 25 ng/mL — ABNORMAL LOW (ref 30–100)

## 2023-12-18 ENCOUNTER — Ambulatory Visit
Admission: RE | Admit: 2023-12-18 | Discharge: 2023-12-18 | Disposition: A | Discharge status: Home / Self Care | Payer: 59 | Source: Ambulatory Visit | Attending: Obstetrics and Gynecology | Admitting: Obstetrics and Gynecology

## 2023-12-18 DIAGNOSIS — Z1231 Encounter for screening mammogram for malignant neoplasm of breast: Secondary | ICD-10-CM | POA: Diagnosis not present

## 2023-12-26 ENCOUNTER — Encounter: Payer: Self-pay | Admitting: Obstetrics and Gynecology

## 2024-05-03 DIAGNOSIS — L814 Other melanin hyperpigmentation: Secondary | ICD-10-CM | POA: Diagnosis not present

## 2024-05-03 DIAGNOSIS — D485 Neoplasm of uncertain behavior of skin: Secondary | ICD-10-CM | POA: Diagnosis not present

## 2024-05-03 DIAGNOSIS — D2272 Melanocytic nevi of left lower limb, including hip: Secondary | ICD-10-CM | POA: Diagnosis not present

## 2024-05-03 DIAGNOSIS — L821 Other seborrheic keratosis: Secondary | ICD-10-CM | POA: Diagnosis not present

## 2024-05-03 DIAGNOSIS — D225 Melanocytic nevi of trunk: Secondary | ICD-10-CM | POA: Diagnosis not present

## 2024-09-18 ENCOUNTER — Ambulatory Visit
Admission: RE | Admit: 2024-09-18 | Discharge: 2024-09-18 | Disposition: A | Source: Ambulatory Visit | Attending: Family Medicine | Admitting: Family Medicine

## 2024-09-18 ENCOUNTER — Other Ambulatory Visit: Payer: Self-pay | Admitting: Family Medicine

## 2024-09-18 DIAGNOSIS — M79644 Pain in right finger(s): Secondary | ICD-10-CM

## 2024-10-04 ENCOUNTER — Ambulatory Visit

## 2024-10-04 ENCOUNTER — Ambulatory Visit: Admission: RE | Admit: 2024-10-04 | Source: Ambulatory Visit

## 2024-10-04 VITALS — BP 110/76 | Ht 65.0 in | Wt 133.0 lb

## 2024-10-04 DIAGNOSIS — M5416 Radiculopathy, lumbar region: Secondary | ICD-10-CM

## 2024-10-04 DIAGNOSIS — M545 Low back pain, unspecified: Secondary | ICD-10-CM

## 2024-10-04 MED ORDER — PREDNISONE 20 MG PO TABS: 40.0000 mg | ORAL_TABLET | Freq: Every day | ORAL | 0 refills | Status: AC

## 2024-10-04 NOTE — Progress Notes (Cosign Needed)
 [facility] Department of [facility]   PCP: Leonel Cole, MD  CHIEF COMPLAINT: Left-sided low back pain with numbness and tingling of the left leg  HPI: Patient is a pleasant 59 y.o. female who presents today for evaluation of left-sided low back pain with numbness and tingling of left leg extending down the anterior lateral thigh and into her shin.  Patient says initial low back pain started in December.  Denied any acute inciting events that she can recall.  Patient is extremely  active and exercises 5-6 times weekly with both weights and cardio.  In December she endorsed having some low back pain with ab exercises.  Continued working out through pain.  Eventually she began to experience pain, numbness, and tingling down her left leg.  She has not tried any rehab specific exercises or oral medications.  As of today, patient is still experiencing low back pain with radicular symptoms down her left leg.  Symptoms sporadic but daily.  Here today for further evaluation.   PMH:  Past Medical History:  Diagnosis Date   Anemia    with heavy menses   COVID-19 09/2020   Endometriosis    Family history of breast cancer    Family history of polyps in the colon    Family history of skin cancer    Genital warts    History of fainting spells of unknown cause    Hx of adenomatous polyp of colon 07/15/2016   Increased risk of breast cancer 2021   Leukopenia    Low vitamin D  level    UTI (urinary tract infection)     Patient Active Problem List   Diagnosis Date Noted   History of dysplastic nevus 10/20/2022   Nevus of left heel 10/20/2022   Nevus of right shoulder 10/20/2022   Seborrheic keratoses 10/20/2022   Sun-damaged skin 10/20/2022   Genetic testing 10/09/2019   Family history of breast cancer    Family history of skin cancer    Hx of adenomatous polyp of colon 07/15/2016    PSurg:  Past Surgical History:  Procedure Laterality Date    BILATERAL SALPINGECTOMY  08/29/2011   Performed with hysterectomy   COLONOSCOPY W/ POLYPECTOMY  06/2016   Diminutive adenoma   VAGINAL HYSTERECTOMY  08/29/2011   has her ovaries-done in [place] .  Pathology:  Uterus with focal complex hyperplasia without atypia, fibroids, adenomyosis. Cervix: benign. Tubes:  normal.     Allergies: Codeine  Meds:  Previous Medications   No medications on file    Social:  Social History   Tobacco Use   Smoking status: Never   Smokeless tobacco: Never  Substance Use Topics   Alcohol use: Yes    Alcohol/week: 2.0 standard drinks of alcohol    Types: 2 Glasses of wine per week    REVIEW OF SYSTEMS:  ROS negative except as noted in HPI above   Objective Exam:  Vitals:   10/04/24 1119  BP: 110/76  Weight: 133 lb (60.3 kg)  Height: 5' 5 (1.651 m)    GENERAL: Patient is afebrile, Vital signs reviewed, well appearing, Patient appears comfortable, Alert and lucid. No apparent distress.   Physical Exam   Ortho Exam:  On examination of patient's lumbar spine no evidence of erythema, ecchymosis, or signs of trauma.  Patient has no tenderness to palpation along spinous processes.  No step-offs palpated.  Patient has full range of motion of her lumbar spine.  Patient  has 5/5 strength in bilateral lower extremities.  Neurovascular intact distally at time of exam.  Negative stork test.  Positive straight leg raise reproducing radicular symptoms.  RESULTS:  Labs: No results found for this or any previous visit (from the past 48 hours).  Imaging:  No orders to display    Assessment/Plan:  1. Acute left-sided low back pain   2. Lumbar radiculopathy   - Exam consistent with acute left-sided low back pain with radiculopathy.  Patient does not have any red flags or evidence of lower extremity weakness. - Patient is traveling out of state next week and seeking options for resolution of pain.  Given pain ongoing for greater than 6 weeks with  radicular symptoms  will prescribe prednisone  40 mg x 6 days - Additionally send referral to physical therapy for low back exercises - Patient will follow-up in 4 weeks for reevaluation - If pain still persisting at this time with radicular symptoms we will likely consider ordering an MRI for further evaluation. - Patient understands and agrees to treatment plan.  New Prescriptions   PREDNISONE  (DELTASONE ) 20 MG TABLET    Take 2 tablets (40 mg total) by mouth daily with breakfast for 6 days.    Medications, medical history, allergies, surgical history, hospitalizations, family history, social history, ROS and vitals entered by nursing staff and reviewed by myself.  I discussed with the patient the diagnosis, treatment plan, indications for return to the emergency department, and for expected follow-up. The patient verbalized an understanding. The patient is asked if there are any questions or concerns. We discuss the case, until all issues are addressed to the patient's satisfaction.  Follow up per instructions including returning for additional office visit if symptoms worsen or proceeding to the emergency department or urgent care in the next 12-24hrs if there is an acute concerning increasing symptoms, pain, fevers, or other symptoms.  Prentice Agent, DO  4:20 PM, 10/04/2024

## 2024-11-15 ENCOUNTER — Ambulatory Visit

## 2024-11-21 ENCOUNTER — Ambulatory Visit: Admitting: Physician Assistant

## 2024-12-23 ENCOUNTER — Ambulatory Visit: Admitting: Obstetrics and Gynecology
